# Patient Record
Sex: Female | Born: 1990 | Race: Black or African American | Hispanic: No | Marital: Single | Smoking: Former smoker
Health system: Southern US, Community
[De-identification: ages and names within clinical notes are randomized; demographics above are authoritative.]

## PROBLEM LIST (undated history)

## (undated) ENCOUNTER — Inpatient Hospital Stay (HOSPITAL_COMMUNITY): Payer: Self-pay

## (undated) DIAGNOSIS — A749 Chlamydial infection, unspecified: Secondary | ICD-10-CM

## (undated) DIAGNOSIS — D649 Anemia, unspecified: Secondary | ICD-10-CM

## (undated) HISTORY — PX: APPENDECTOMY: SHX54

---
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
## 2009-01-14 ENCOUNTER — Inpatient Hospital Stay (HOSPITAL_COMMUNITY): Admission: AD | Admit: 2009-01-14 | Discharge: 2009-01-15 | Payer: Self-pay | Admitting: Obstetrics & Gynecology

## 2009-02-16 ENCOUNTER — Emergency Department (HOSPITAL_COMMUNITY): Admission: EM | Admit: 2009-02-16 | Discharge: 2009-02-16 | Payer: Self-pay | Admitting: Family Medicine

## 2009-05-05 ENCOUNTER — Emergency Department (HOSPITAL_COMMUNITY): Admission: EM | Admit: 2009-05-05 | Discharge: 2009-05-05 | Payer: Self-pay | Admitting: Family Medicine

## 2009-06-03 ENCOUNTER — Emergency Department (HOSPITAL_COMMUNITY): Admission: EM | Admit: 2009-06-03 | Discharge: 2009-06-03 | Payer: Self-pay | Admitting: Emergency Medicine

## 2009-06-19 ENCOUNTER — Inpatient Hospital Stay (HOSPITAL_COMMUNITY): Admission: AD | Admit: 2009-06-19 | Discharge: 2009-06-19 | Payer: Self-pay | Admitting: Obstetrics and Gynecology

## 2009-06-19 ENCOUNTER — Ambulatory Visit: Payer: Self-pay | Admitting: Advanced Practice Midwife

## 2009-12-12 ENCOUNTER — Inpatient Hospital Stay (HOSPITAL_COMMUNITY): Admission: AD | Admit: 2009-12-12 | Discharge: 2009-12-12 | Payer: Self-pay | Admitting: Obstetrics & Gynecology

## 2009-12-18 ENCOUNTER — Emergency Department (HOSPITAL_COMMUNITY): Admission: EM | Admit: 2009-12-18 | Discharge: 2009-12-18 | Payer: Self-pay | Admitting: Emergency Medicine

## 2010-01-27 IMAGING — US US OB TRANSVAGINAL MODIFY
1 series · 14 of 28 positions shown · non-contrast
Comparison: none

CLINICAL DATA: 6 weeks 2 days pregnant.  The lower abdominal pain.

OBSTETRIC <14 WK US AND TRANSVAGINAL OB US
TECHNIQUE: Both transabdominal and transvaginal ultrasound
examinations were performed for complete evaluation of the
gestation as well as the maternal uterus, adnexal regions, and
pelvic cul-de-sac.
No intrauterine pregnancy is identified.  There is a hyperechoic
material within the endometrial canal measuring at least 17 mm. The
uterus is otherwise unremarkable.  The ovaries are within normal
limits bilaterally.  A small amount of free fluid is evident.

[Series 1: us ob transvaginal modify · 0.26mm/px · 14 of 45 slices shown]
[im 2/45]
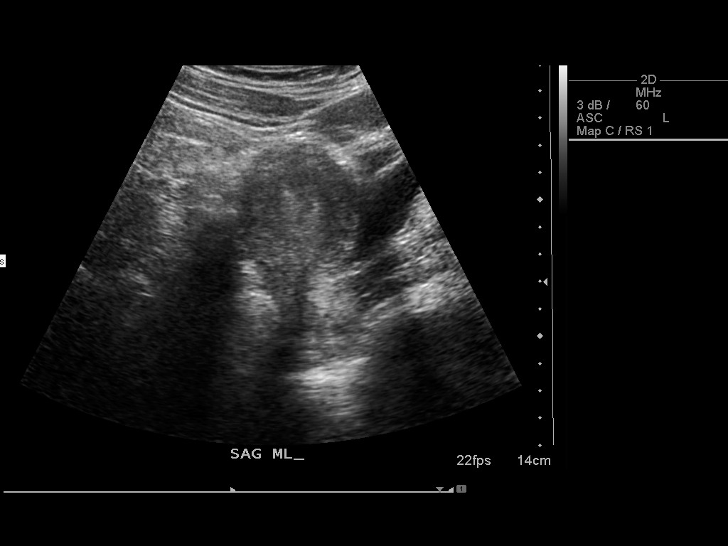
[im 5/45]
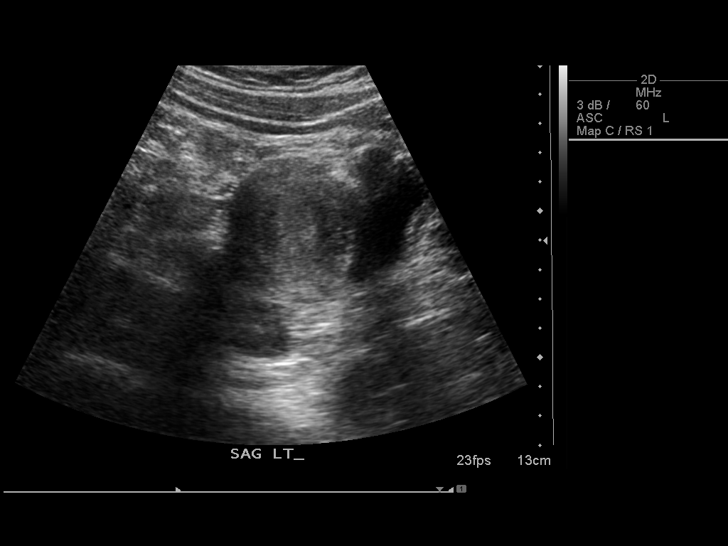
[im 9/45]
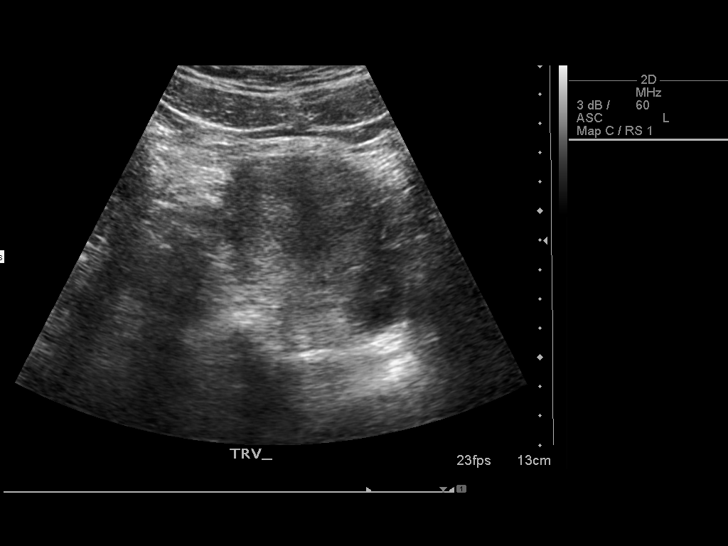
[im 12/45]
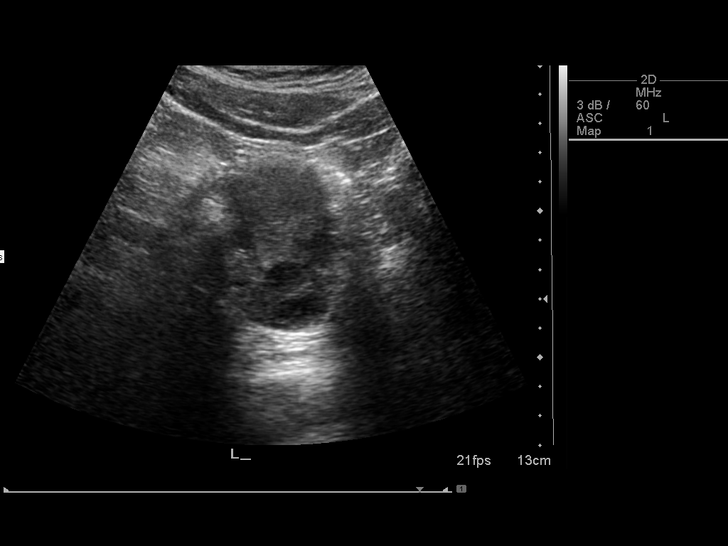
[im 15/45]
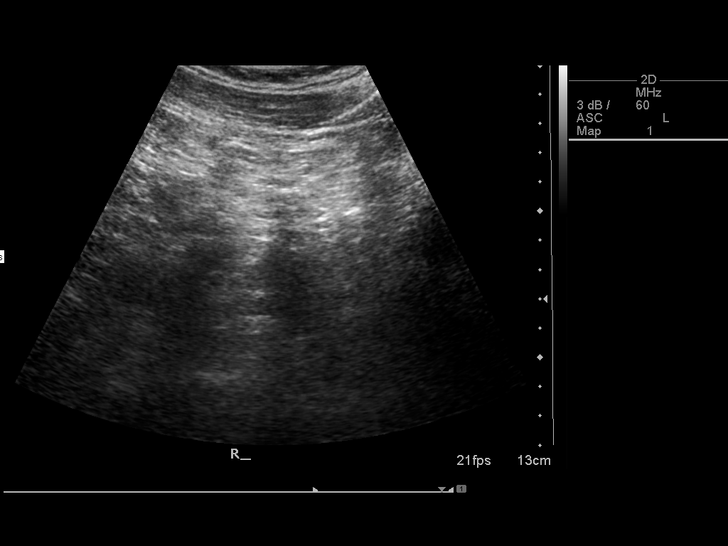
[im 18/45]
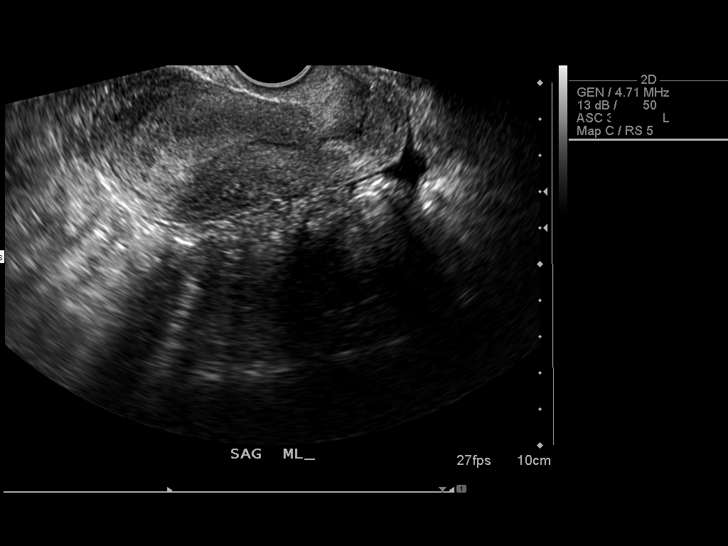
[im 22/45]
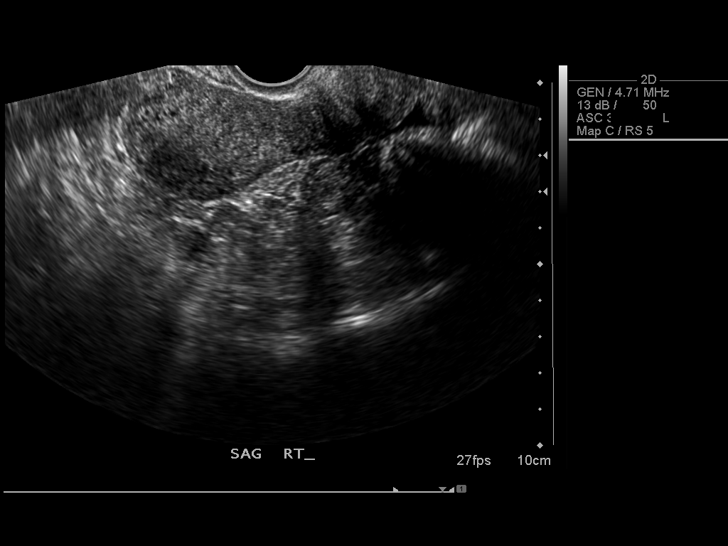
[im 25/45]
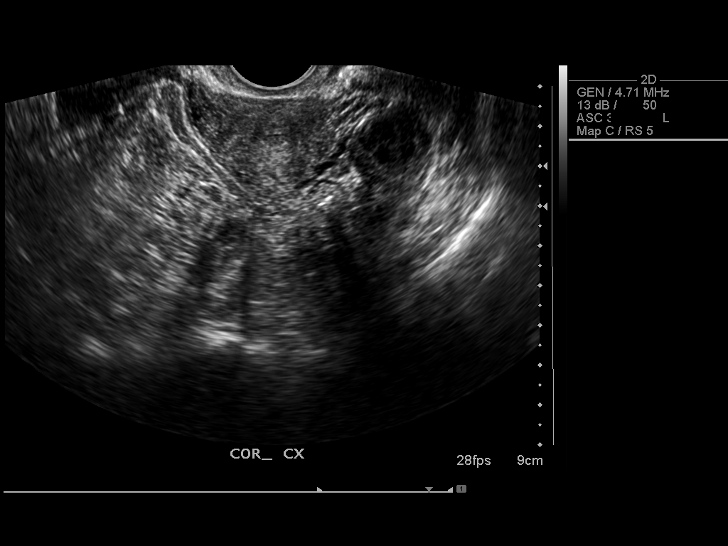
[im 28/45]
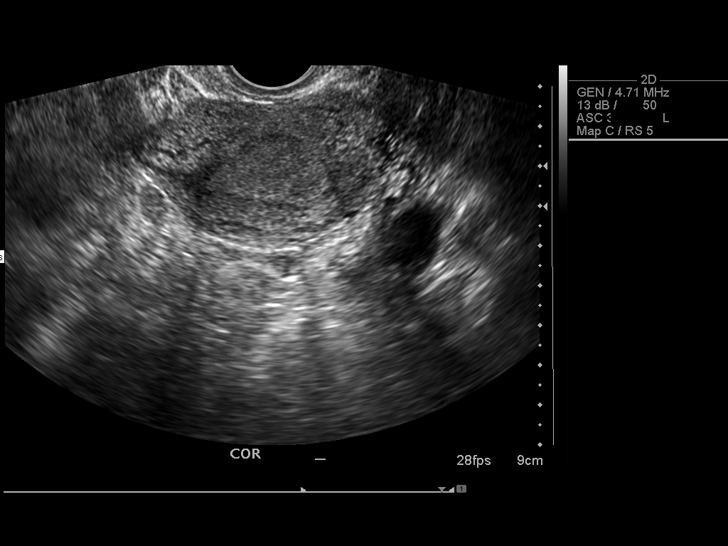
[im 31/45]
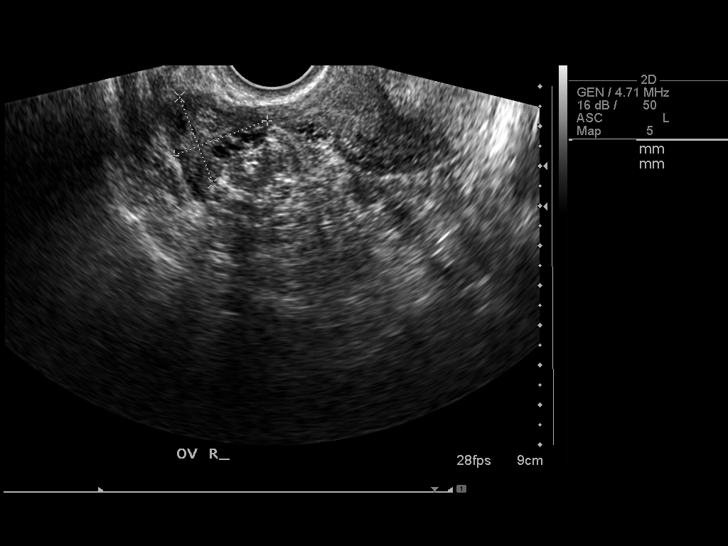
[im 35/45]
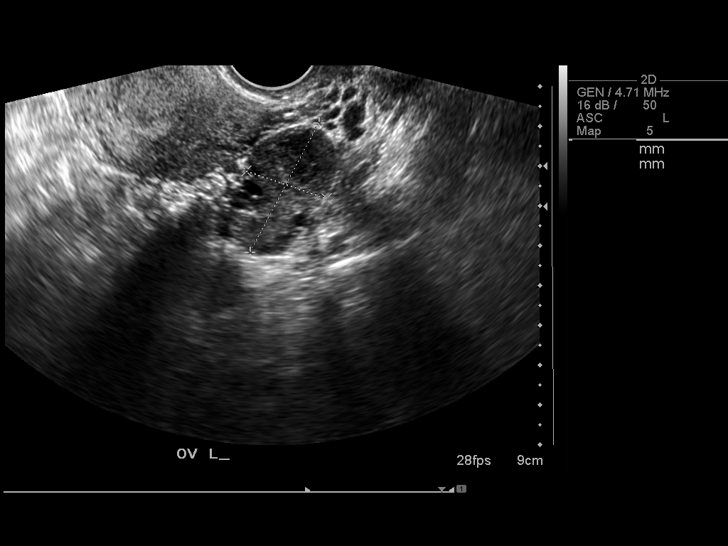
[im 38/45]
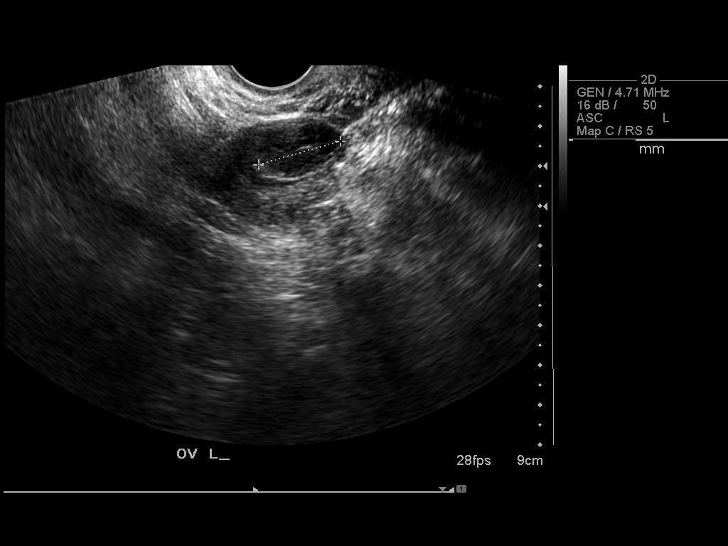
[im 41/45]
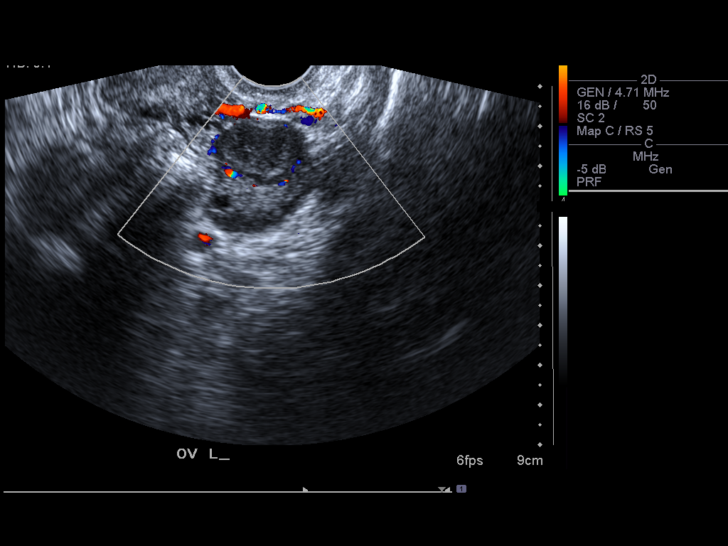
[im 45/45]
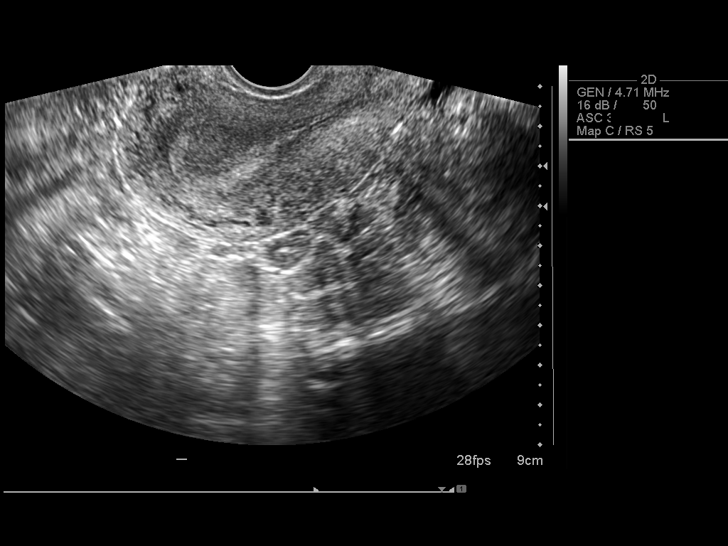

[14 of 28 positions shown; findings below may reference images not displayed]

IMPRESSION: 1.  No evidence for intrauterine pregnancy.
2.  Hyperechoic material within the endometrial cavity, concerning
for blood.  This may represent a missed spontaneous abortion.
Correlation with beta HCG is recommended.  If the quantitative beta
HCG is greater than 2888, the findings are concerning for an occult
ectopic pregnancy and follow-up exam is recommended.  If the
quantitative beta HCG is less than 2888, this may represent a
spontaneous abortion or early IUP and follow-up may can be useful.
3.  Minimal free fluid.

## 2010-02-19 ENCOUNTER — Inpatient Hospital Stay (HOSPITAL_COMMUNITY): Admission: AD | Admit: 2010-02-19 | Discharge: 2010-02-20 | Payer: Self-pay | Admitting: Obstetrics and Gynecology

## 2010-03-15 ENCOUNTER — Ambulatory Visit (HOSPITAL_COMMUNITY): Admission: RE | Admit: 2010-03-15 | Discharge: 2010-03-15 | Payer: Self-pay | Admitting: Family Medicine

## 2010-03-15 ENCOUNTER — Encounter: Payer: Self-pay | Admitting: Family Medicine

## 2010-04-05 ENCOUNTER — Ambulatory Visit (HOSPITAL_COMMUNITY): Admission: RE | Admit: 2010-04-05 | Discharge: 2010-04-05 | Payer: Self-pay | Admitting: Family Medicine

## 2010-04-27 ENCOUNTER — Ambulatory Visit (HOSPITAL_COMMUNITY): Admission: RE | Admit: 2010-04-27 | Discharge: 2010-04-27 | Payer: Self-pay | Admitting: Obstetrics & Gynecology

## 2010-05-31 ENCOUNTER — Inpatient Hospital Stay (HOSPITAL_COMMUNITY): Admission: AD | Admit: 2010-05-31 | Discharge: 2010-06-01 | Payer: Self-pay | Admitting: Obstetrics & Gynecology

## 2010-05-31 ENCOUNTER — Ambulatory Visit: Payer: Self-pay | Admitting: Gynecology

## 2010-06-01 ENCOUNTER — Encounter: Payer: Self-pay | Admitting: Obstetrics & Gynecology

## 2010-07-28 ENCOUNTER — Ambulatory Visit: Payer: Self-pay | Admitting: Nurse Practitioner

## 2010-07-28 ENCOUNTER — Inpatient Hospital Stay (HOSPITAL_COMMUNITY): Admission: AD | Admit: 2010-07-28 | Discharge: 2010-07-29 | Payer: Self-pay | Admitting: Obstetrics & Gynecology

## 2010-09-08 ENCOUNTER — Ambulatory Visit (HOSPITAL_COMMUNITY)
Admission: RE | Admit: 2010-09-08 | Discharge: 2010-09-08 | Payer: Self-pay | Source: Home / Self Care | Admitting: Obstetrics & Gynecology

## 2010-09-10 ENCOUNTER — Inpatient Hospital Stay (HOSPITAL_COMMUNITY)
Admission: AD | Admit: 2010-09-10 | Discharge: 2010-09-15 | Payer: Self-pay | Source: Home / Self Care | Attending: Obstetrics & Gynecology | Admitting: Obstetrics & Gynecology

## 2010-09-25 ENCOUNTER — Emergency Department (HOSPITAL_COMMUNITY)
Admission: EM | Admit: 2010-09-25 | Discharge: 2010-09-25 | Payer: Self-pay | Source: Home / Self Care | Admitting: Emergency Medicine

## 2010-10-29 ENCOUNTER — Encounter: Payer: Self-pay | Admitting: Obstetrics & Gynecology

## 2010-10-29 ENCOUNTER — Encounter: Payer: Self-pay | Admitting: Family Medicine

## 2010-12-19 LAB — URINE MICROSCOPIC-ADD ON

## 2010-12-19 LAB — URINALYSIS, ROUTINE W REFLEX MICROSCOPIC
Bilirubin Urine: NEGATIVE
Glucose, UA: NEGATIVE mg/dL
Nitrite: NEGATIVE
Specific Gravity, Urine: >1.03 — ABNORMAL HIGH (ref 1.005–1.030)
pH: 6 (ref 5.0–8.0)

## 2010-12-19 LAB — CROSSMATCH
ABO/RH(D): O POS
Antibody Screen: NEGATIVE
Unit division: 0

## 2010-12-19 LAB — CBC
HCT: 21.1 % — ABNORMAL LOW (ref 36.0–46.0)
HCT: 22.5 % — ABNORMAL LOW (ref 36.0–46.0)
Hemoglobin: 7 g/dL — ABNORMAL LOW (ref 12.0–15.0)
MCH: 20 pg — ABNORMAL LOW (ref 26.0–34.0)
MCH: 20.1 pg — ABNORMAL LOW (ref 26.0–34.0)
MCHC: 30.9 g/dL (ref 30.0–36.0)
MCV: 65.1 fL — ABNORMAL LOW (ref 78.0–100.0)
MCV: 65.8 fL — ABNORMAL LOW (ref 78.0–100.0)
Platelets: 126 10*3/uL — ABNORMAL LOW (ref 150–400)
Platelets: 164 10*3/uL (ref 150–400)
Platelets: 188 10*3/uL (ref 150–400)
RBC: 2.49 MIL/uL — ABNORMAL LOW (ref 3.87–5.11)
RBC: 3.34 MIL/uL — ABNORMAL LOW (ref 3.87–5.11)
RBC: 3.46 MIL/uL — ABNORMAL LOW (ref 3.87–5.11)
RDW: 23.1 % — ABNORMAL HIGH (ref 11.5–15.5)
RDW: 23.5 % — ABNORMAL HIGH (ref 11.5–15.5)
WBC: 12 10*3/uL — ABNORMAL HIGH (ref 4.0–10.5)
WBC: 15.6 10*3/uL — ABNORMAL HIGH (ref 4.0–10.5)
WBC: 16 10*3/uL — ABNORMAL HIGH (ref 4.0–10.5)
WBC: 9.1 10*3/uL (ref 4.0–10.5)

## 2010-12-19 LAB — TYPE AND SCREEN
ABO/RH(D): O POS
Antibody Screen: NEGATIVE

## 2010-12-19 LAB — COMPREHENSIVE METABOLIC PANEL
ALT: 8 U/L (ref 0–35)
AST: 18 U/L (ref 0–37)
Albumin: 2.8 g/dL — ABNORMAL LOW (ref 3.5–5.2)
Alkaline Phosphatase: 88 U/L (ref 39–117)
Potassium: 4 mEq/L (ref 3.5–5.1)
Sodium: 137 mEq/L (ref 135–145)
Total Protein: 5.9 g/dL — ABNORMAL LOW (ref 6.0–8.3)

## 2010-12-19 LAB — HEMOGLOBIN AND HEMATOCRIT, BLOOD: Hemoglobin: 4.7 g/dL — CL (ref 12.0–15.0)

## 2010-12-19 LAB — RPR: RPR Ser Ql: NONREACTIVE

## 2010-12-20 LAB — WET PREP, GENITAL
Trich, Wet Prep: NONE SEEN
Yeast Wet Prep HPF POC: NONE SEEN

## 2010-12-20 LAB — URINALYSIS, ROUTINE W REFLEX MICROSCOPIC
Ketones, ur: NEGATIVE mg/dL
Nitrite: NEGATIVE
Protein, ur: NEGATIVE mg/dL

## 2010-12-22 LAB — COMPREHENSIVE METABOLIC PANEL
ALT: 15 U/L (ref 0–35)
AST: 24 U/L (ref 0–37)
Alkaline Phosphatase: 41 U/L (ref 39–117)
CO2: 20 mEq/L (ref 19–32)
Calcium: 8.8 mg/dL (ref 8.4–10.5)
Chloride: 105 mEq/L (ref 96–112)
GFR calc non Af Amer: >60 mL/min (ref >60)
Glucose, Bld: 89 mg/dL (ref 70–99)
Sodium: 133 mEq/L — ABNORMAL LOW (ref 135–145)
Total Bilirubin: 0.4 mg/dL (ref 0.3–1.2)

## 2010-12-22 LAB — DIFFERENTIAL
Basophils Absolute: 0 10*3/uL (ref 0.0–0.1)
Eosinophils Absolute: 0.1 10*3/uL (ref 0.0–0.7)
Lymphs Abs: 0.9 10*3/uL (ref 0.7–4.0)
Monocytes Absolute: 0.6 10*3/uL (ref 0.1–1.0)
Monocytes Relative: 6 % (ref 3–12)
Neutro Abs: 9.2 10*3/uL — ABNORMAL HIGH (ref 1.7–7.7)

## 2010-12-22 LAB — CBC
HCT: 23.8 % — ABNORMAL LOW (ref 36.0–46.0)
Hemoglobin: 7.5 g/dL — ABNORMAL LOW (ref 12.0–15.0)
MCHC: 31.5 g/dL (ref 30.0–36.0)
RBC: 3.34 MIL/uL — ABNORMAL LOW (ref 3.87–5.11)

## 2010-12-22 LAB — URINALYSIS, ROUTINE W REFLEX MICROSCOPIC
Bilirubin Urine: NEGATIVE
Hgb urine dipstick: NEGATIVE
Ketones, ur: NEGATIVE mg/dL
Nitrite: NEGATIVE
Urobilinogen, UA: 0.2 mg/dL (ref 0.0–1.0)
pH: 7 (ref 5.0–8.0)

## 2010-12-22 LAB — URINE MICROSCOPIC-ADD ON

## 2010-12-25 LAB — URINALYSIS, ROUTINE W REFLEX MICROSCOPIC
Bilirubin Urine: NEGATIVE
Glucose, UA: NEGATIVE mg/dL
Ketones, ur: NEGATIVE mg/dL
Nitrite: NEGATIVE
Specific Gravity, Urine: >1.03 — ABNORMAL HIGH (ref 1.005–1.030)
pH: 5.5 (ref 5.0–8.0)

## 2010-12-25 LAB — WET PREP, GENITAL: Trich, Wet Prep: NONE SEEN

## 2010-12-25 LAB — GC/CHLAMYDIA PROBE AMP, GENITAL: Chlamydia, DNA Probe: NEGATIVE

## 2010-12-25 LAB — POCT PREGNANCY, URINE: Preg Test, Ur: POSITIVE

## 2010-12-31 LAB — GC/CHLAMYDIA PROBE AMP, GENITAL: GC Probe Amp, Genital: NEGATIVE

## 2010-12-31 LAB — WET PREP, GENITAL
Trich, Wet Prep: NONE SEEN
Yeast Wet Prep HPF POC: NONE SEEN

## 2010-12-31 LAB — URINALYSIS, ROUTINE W REFLEX MICROSCOPIC
Bilirubin Urine: NEGATIVE
Hgb urine dipstick: NEGATIVE
Ketones, ur: NEGATIVE mg/dL
Nitrite: NEGATIVE
Specific Gravity, Urine: >1.03 — ABNORMAL HIGH (ref 1.005–1.030)
Urobilinogen, UA: 0.2 mg/dL (ref 0.0–1.0)
pH: 5.5 (ref 5.0–8.0)

## 2011-01-14 LAB — WET PREP, GENITAL

## 2011-01-14 LAB — GC/CHLAMYDIA PROBE AMP, GENITAL: GC Probe Amp, Genital: NEGATIVE

## 2011-01-14 LAB — POCT PREGNANCY, URINE: Preg Test, Ur: NEGATIVE

## 2011-01-16 LAB — POCT URINALYSIS DIP (DEVICE)
Glucose, UA: NEGATIVE mg/dL
Hgb urine dipstick: NEGATIVE
Ketones, ur: NEGATIVE mg/dL
Nitrite: NEGATIVE
Protein, ur: 30 mg/dL — AB
Specific Gravity, Urine: 1.025 (ref 1.005–1.030)
Urobilinogen, UA: 1 mg/dL (ref 0.0–1.0)
pH: 6.5 (ref 5.0–8.0)

## 2011-01-16 LAB — GC/CHLAMYDIA PROBE AMP, GENITAL
Chlamydia, DNA Probe: NEGATIVE
GC Probe Amp, Genital: NEGATIVE

## 2011-01-16 LAB — WET PREP, GENITAL
Trich, Wet Prep: NONE SEEN
Yeast Wet Prep HPF POC: NONE SEEN

## 2011-01-16 LAB — RPR: RPR Ser Ql: NONREACTIVE

## 2011-01-16 LAB — POCT PREGNANCY, URINE: Preg Test, Ur: NEGATIVE

## 2011-01-17 LAB — GC/CHLAMYDIA PROBE AMP, GENITAL
Chlamydia, DNA Probe: NEGATIVE
GC Probe Amp, Genital: NEGATIVE

## 2011-01-17 LAB — CBC
Platelets: 203 10*3/uL (ref 150–400)
RBC: 3.38 MIL/uL — ABNORMAL LOW (ref 3.87–5.11)
WBC: 8.1 10*3/uL (ref 4.0–10.5)

## 2011-01-17 LAB — DIFFERENTIAL
Lymphocytes Relative: 7 % — ABNORMAL LOW (ref 12–46)
Lymphs Abs: 0.5 10*3/uL — ABNORMAL LOW (ref 0.7–4.0)
Neutro Abs: 6.9 10*3/uL (ref 1.7–7.7)
Neutrophils Relative %: 85 % — ABNORMAL HIGH (ref 43–77)

## 2011-01-18 LAB — CBC
HCT: 27 % — ABNORMAL LOW (ref 36.0–46.0)
Platelets: 234 10*3/uL (ref 150–400)
WBC: 10 10*3/uL (ref 4.0–10.5)

## 2011-01-18 LAB — GC/CHLAMYDIA PROBE AMP, GENITAL
Chlamydia, DNA Probe: NEGATIVE
Chlamydia, DNA Probe: NEGATIVE
GC Probe Amp, Genital: NEGATIVE

## 2011-01-18 LAB — URINALYSIS, ROUTINE W REFLEX MICROSCOPIC
Bilirubin Urine: NEGATIVE
Nitrite: NEGATIVE
Specific Gravity, Urine: >1.03 — ABNORMAL HIGH (ref 1.005–1.030)
Urobilinogen, UA: 0.2 mg/dL (ref 0.0–1.0)

## 2011-01-18 LAB — WET PREP, GENITAL: Trich, Wet Prep: NONE SEEN

## 2011-06-19 ENCOUNTER — Emergency Department: Payer: Self-pay | Admitting: Emergency Medicine

## 2011-06-29 LAB — URINE MICROSCOPIC-ADD ON

## 2011-06-29 LAB — URINALYSIS, ROUTINE W REFLEX MICROSCOPIC
Bilirubin Urine: NEGATIVE
Glucose, UA: NEGATIVE
Hgb urine dipstick: NEGATIVE
Ketones, ur: NEGATIVE
Nitrite: NEGATIVE
Protein, ur: NEGATIVE
Specific Gravity, Urine: 1.025
Urobilinogen, UA: 0.2
pH: 6

## 2011-06-29 LAB — POCT PREGNANCY, URINE
Operator id: 280921
Preg Test, Ur: NEGATIVE

## 2011-07-09 LAB — POCT PREGNANCY, URINE: Preg Test, Ur: NEGATIVE

## 2011-07-10 LAB — WET PREP, GENITAL
Clue Cells Wet Prep HPF POC: NONE SEEN
Trich, Wet Prep: NONE SEEN

## 2011-07-10 LAB — GC/CHLAMYDIA PROBE AMP, GENITAL
Chlamydia, DNA Probe: NEGATIVE
GC Probe Amp, Genital: NEGATIVE

## 2011-07-10 LAB — POCT PREGNANCY, URINE: Preg Test, Ur: POSITIVE

## 2011-07-10 LAB — CBC
MCHC: 32.2
RBC: 3.56 — ABNORMAL LOW
RDW: 20.9 — ABNORMAL HIGH

## 2011-07-10 LAB — URINALYSIS, ROUTINE W REFLEX MICROSCOPIC
Bilirubin Urine: NEGATIVE
Nitrite: NEGATIVE
Specific Gravity, Urine: >1.03 — ABNORMAL HIGH
Urobilinogen, UA: 0.2

## 2011-07-13 LAB — HCG, QUANTITATIVE, PREGNANCY
hCG, Beta Chain, Quant, S: 4055 m[IU]/mL — ABNORMAL HIGH (ref <5)
hCG, Beta Chain, Quant, S: 78 m[IU]/mL — ABNORMAL HIGH (ref <5)

## 2011-07-17 LAB — URINE MICROSCOPIC-ADD ON

## 2011-07-17 LAB — URINALYSIS, ROUTINE W REFLEX MICROSCOPIC
Glucose, UA: NEGATIVE
Leukocytes, UA: NEGATIVE
Nitrite: NEGATIVE
Specific Gravity, Urine: >1.03 — ABNORMAL HIGH
pH: 5.5

## 2011-07-17 LAB — WET PREP, GENITAL
Clue Cells Wet Prep HPF POC: NONE SEEN
Trich, Wet Prep: NONE SEEN
Yeast Wet Prep HPF POC: NONE SEEN

## 2011-07-17 LAB — POCT PREGNANCY, URINE: Operator id: 114931

## 2011-10-09 DIAGNOSIS — A749 Chlamydial infection, unspecified: Secondary | ICD-10-CM

## 2011-10-09 HISTORY — DX: Chlamydial infection, unspecified: A74.9

## 2012-01-25 ENCOUNTER — Inpatient Hospital Stay (HOSPITAL_COMMUNITY)
Admission: AD | Admit: 2012-01-25 | Discharge: 2012-01-25 | Discharge status: Against Medical Advice | Payer: Self-pay | Source: Ambulatory Visit | Attending: Obstetrics & Gynecology | Admitting: Obstetrics & Gynecology

## 2012-01-25 NOTE — MAU Note (Signed)
Patient is not in the lobby when called to triage.  

## 2012-01-25 NOTE — MAU Note (Signed)
Patient not in the lobby when called to triage.  

## 2012-02-02 ENCOUNTER — Inpatient Hospital Stay (HOSPITAL_COMMUNITY)
Admission: AD | Admit: 2012-02-02 | Discharge: 2012-02-02 | Discharge status: Against Medical Advice | Payer: Self-pay | Source: Ambulatory Visit | Attending: Obstetrics | Admitting: Obstetrics

## 2012-02-02 ENCOUNTER — Encounter (HOSPITAL_COMMUNITY): Payer: Self-pay | Admitting: Obstetrics and Gynecology

## 2012-02-02 DIAGNOSIS — Z3202 Encounter for pregnancy test, result negative: Secondary | ICD-10-CM

## 2012-02-02 DIAGNOSIS — N912 Amenorrhea, unspecified: Secondary | ICD-10-CM | POA: Insufficient documentation

## 2012-02-02 DIAGNOSIS — N949 Unspecified condition associated with female genital organs and menstrual cycle: Secondary | ICD-10-CM | POA: Insufficient documentation

## 2012-02-02 DIAGNOSIS — N926 Irregular menstruation, unspecified: Secondary | ICD-10-CM

## 2012-02-02 HISTORY — DX: Chlamydial infection, unspecified: A74.9

## 2012-02-02 HISTORY — DX: Anemia, unspecified: D64.9

## 2012-02-02 LAB — URINALYSIS, ROUTINE W REFLEX MICROSCOPIC
Glucose, UA: NEGATIVE mg/dL
Ketones, ur: NEGATIVE mg/dL
Leukocytes, UA: NEGATIVE
Protein, ur: NEGATIVE mg/dL
Urobilinogen, UA: 0.2 mg/dL (ref 0.0–1.0)

## 2012-02-02 NOTE — MAU Note (Signed)
Pt presents to MAU with chief complaint of "missed period times 2 months". Pt says she has never had irregular cycles; but in the last 2 months she has not had a period. Pt has a new sexual partner and recently noticed a discharge; thick, white with foul odor. Pt is a G5P1

## 2012-02-02 NOTE — Progress Notes (Signed)
Pt had to leave AMA due to chid care issues.

## 2012-02-02 NOTE — Discharge Instructions (Signed)
Secondary Amenorrhea  Secondary amenorrhea is the stopping of menstrual flow for 3 to 6 months in a female who has previously had periods. There are many possible causes. Most of these causes are not serious. Usually treating the underlying problem causing the loss of menses will return your periods to normal. CAUSES  Some common and uncommon causes of not menstruating include:  Malnutrition.   Low blood sugar (hypoglycemia).   Polycystic ovarian disease.   Stress or fear.   Breastfeeding.   Hormone imbalance.   Ovarian failure.   Medications.   Extreme obesity.   Cystic fibrosis.   Low body weight or drastic weight reduction from any cause.   Early menopause.   Removal of ovaries or uterus.   Contraceptives.   Illness.   Long term (chronic) illnesses.   Cushing's syndrome.   Thyroid problems.   Birth control pills, patches, or vaginal rings for birth control.  DIAGNOSIS  This diagnosis is made by your caregiver taking a medical history and doing a physical exam. Pregnancy must be ruled out. Often times, numerous blood tests of different hormones in the body may be measured. Urine testing may be done. Specialized x-rays may have to be done as well as measuring the body mass index (BMI). TREATMENT  Treatment depends on the cause of the amenorrhea. If an eating disorder is present, this can be treated with an adequate diet and therapy. Chronic illnesses may improve with treatment of the illness. Overall, the outlook is good. The amenorrhea may be corrected with medications, lifestyle changes, or surgery. If the amenorrhea cannot be corrected, it is sometimes possible to create a false menstruation with medications. Document Released: 11/05/2006 Document Revised: 09/13/2011 Document Reviewed: 09/12/2007 Exit[employer] Patient Information 2012 [employer]. 

## 2012-02-02 NOTE — MAU Provider Note (Signed)
History     CSN: [id number]  Arrival date and time: 02/02/12 1640   First Provider Initiated Contact with Patient 02/02/12 1755      Chief Complaint  Patient presents with  . Vaginal Discharge  . Amenorrhea   HPI Sharon Conner is 21 y.o. [phone] Unknown weeks presenting missed period for 2 months.  When I came in the room patient states she has to go home to take care of her daughter- who had accident in her diaper and there are no clothes for her.  She denies any urgent need for care.  Denies pain.  She has to go home to take care of her daughter.  Plans to go to health department for birth control that she needs now.      Past Medical History  Diagnosis Date  . Anemia   . Chlamydia infection     Past Surgical History  Procedure Date  . Appendectomy     History reviewed. No pertinent family history.  History  Substance Use Topics  . Smoking status: Current Some Day Smoker  . Smokeless tobacco: Not on file  . Alcohol Use: Yes    Allergies: No Known Allergies  Prescriptions prior to admission  Medication Sig Dispense Refill  . acetaminophen (TYLENOL) 500 MG tablet Take 1,000 mg by mouth every 6 (six) hours as needed. For pain        Review of Systems  Constitutional: Negative.   Gastrointestinal: Negative for nausea, vomiting and abdominal pain.  Genitourinary:       Missed periods.  Neg for vaginal bleeding  + for vaginal discharge   Physical Exam   Blood pressure 135/80, pulse 68, temperature 98.2 F (36.8 C), temperature source Oral, resp. rate 18.  Physical Exam  Constitutional: She is oriented to person, place, and time. She appears well-developed and well-nourished. No distress.  HENT:  Head: Normocephalic.  Genitourinary:       See initial note, unable to stay for exam  Neurological: She is alert and oriented to person, place, and time.  Psychiatric: She has a normal mood and affect. Her behavior is normal.     Results for orders placed  during the hospital encounter of 02/02/12 (from the past 24 hour(s))  URINALYSIS, ROUTINE W REFLEX MICROSCOPIC     Status: Abnormal   Collection Time   02/02/12  4:45 PM      Component Value Range   Color, Urine YELLOW  YELLOW    APPearance CLEAR  CLEAR    Specific Gravity, Urine >1.030 (*) 1.005 - 1.030    pH 5.5  5.0 - 8.0    Glucose, UA NEGATIVE  NEGATIVE (mg/dL)   Hgb urine dipstick NEGATIVE  NEGATIVE    Bilirubin Urine NEGATIVE  NEGATIVE    Ketones, ur NEGATIVE  NEGATIVE (mg/dL)   Protein, ur NEGATIVE  NEGATIVE (mg/dL)   Urobilinogen, UA 0.2  0.0 - 1.0 (mg/dL)   Nitrite NEGATIVE  NEGATIVE    Leukocytes, UA NEGATIVE  NEGATIVE   POCT PREGNANCY, URINE     Status: Normal   Collection Time   02/02/12  4:50 PM      Component Value Range   Preg Test, Ur NEGATIVE  NEGATIVE     MAU Course  Procedures  MDM--discussed with patient that I will be glad to examine her.  She decline stating she had to go take clean clothes to her baby.  She states she will go to [facility] for STD check and  birth control.    Assessment and Plan  A:  Amenorrhea       Vaginal discharge  P:  Patient will call health department for appt to discuss birth control and have STI screening.    Patient checked out AMA  Deisha Stull,EVE M 02/02/2012, 5:57 PM

## 2012-02-03 NOTE — MAU Provider Note (Signed)
Chart reviewed and agree with management and plan.  

## 2012-02-12 ENCOUNTER — Inpatient Hospital Stay (HOSPITAL_COMMUNITY)
Admission: AD | Admit: 2012-02-12 | Discharge: 2012-02-12 | Discharge status: Against Medical Advice | Payer: Self-pay | Source: Ambulatory Visit | Attending: Family Medicine | Admitting: Family Medicine

## 2012-02-12 DIAGNOSIS — N949 Unspecified condition associated with female genital organs and menstrual cycle: Secondary | ICD-10-CM | POA: Insufficient documentation

## 2012-02-12 DIAGNOSIS — R109 Unspecified abdominal pain: Secondary | ICD-10-CM | POA: Insufficient documentation

## 2012-02-12 DIAGNOSIS — N912 Amenorrhea, unspecified: Secondary | ICD-10-CM | POA: Insufficient documentation

## 2012-02-12 LAB — URINALYSIS, ROUTINE W REFLEX MICROSCOPIC
Bilirubin Urine: NEGATIVE
Ketones, ur: NEGATIVE mg/dL
Nitrite: NEGATIVE
Specific Gravity, Urine: >1.03 — ABNORMAL HIGH (ref 1.005–1.030)
Urobilinogen, UA: 0.2 mg/dL (ref 0.0–1.0)

## 2012-02-12 LAB — URINE MICROSCOPIC-ADD ON

## 2012-02-12 NOTE — MAU Note (Signed)
Not in lobby

## 2012-02-12 NOTE — MAU Note (Signed)
Pt not in lobby.  

## 2012-02-12 NOTE — MAU Note (Signed)
Been having pain in lower abd- shooting into vagina- started after was last here. Hasn't had a period in 2+ months.  Has a d/c with an odor.

## 2012-05-07 ENCOUNTER — Emergency Department (HOSPITAL_COMMUNITY)
Admission: EM | Admit: 2012-05-07 | Discharge: 2012-05-07 | Disposition: A | Discharge status: Home / Self Care | Payer: Self-pay | Attending: Emergency Medicine | Admitting: Emergency Medicine

## 2012-05-07 ENCOUNTER — Encounter (HOSPITAL_COMMUNITY): Payer: Self-pay | Admitting: *Deleted

## 2012-05-07 DIAGNOSIS — D649 Anemia, unspecified: Secondary | ICD-10-CM | POA: Insufficient documentation

## 2012-05-07 DIAGNOSIS — K649 Unspecified hemorrhoids: Secondary | ICD-10-CM

## 2012-05-07 DIAGNOSIS — K644 Residual hemorrhoidal skin tags: Secondary | ICD-10-CM | POA: Insufficient documentation

## 2012-05-07 DIAGNOSIS — F17200 Nicotine dependence, unspecified, uncomplicated: Secondary | ICD-10-CM | POA: Insufficient documentation

## 2012-05-07 MED ORDER — DOCUSATE SODIUM 100 MG PO CAPS: 100.0000 mg | ORAL_CAPSULE | Freq: Two times a day (BID) | ORAL | Status: AC

## 2012-05-07 MED ORDER — HYDROCORTISONE ACETATE 25 MG RE SUPP: 25.0000 mg | Freq: Two times a day (BID) | RECTAL | Status: AC

## 2012-05-07 NOTE — ED Notes (Signed)
Reports no history of hemorrhoids, stated went on internet & looked it up. Denies bleeding. Reports stools are usually hard & has to strain a lot with BM's. C/o painful walking

## 2012-05-07 NOTE — ED Provider Notes (Signed)
Medical screening examination/treatment/procedure(s) were performed by non-physician practitioner and as supervising physician I was immediately available for consultation/collaboration.   Carleene Cooper III, MD 05/07/12 2021

## 2012-05-07 NOTE — ED Notes (Signed)
C/o rectal pain due to hemorrhoids since yesterday

## 2012-05-07 NOTE — ED Provider Notes (Signed)
History     CSN: [id number]  Arrival date & time 05/07/12  4098   First MD Initiated Contact with Patient 05/07/12 [phone]      Chief Complaint  Patient presents with  . Hemorrhoids    (Consider location/radiation/quality/duration/timing/severity/associated sxs/prior treatment) HPI Comments: Sharon Conner presents with a hemorrhoid which started being painful yesterday.  She denies rectal bleeding, but does have problems with chronic constipation.  She denies fevers or chills and has no complaint of abdominal pain,  Nausea or vomiting.  She has taken no medication or treatment for her symptoms.  The history is provided by the patient.    Past Medical History  Diagnosis Date  . Anemia   . Chlamydia infection     Past Surgical History  Procedure Date  . Appendectomy     No family history on file.  History  Substance Use Topics  . Smoking status: Current Some Day Smoker  . Smokeless tobacco: Not on file  . Alcohol Use: Yes    OB History    Grav Para Term Preterm Abortions TAB SAB Ect Mult Living   5 1 1  0 4 3 1  0 0 1      Review of Systems  Constitutional: Negative for fever.  HENT: Negative for congestion, sore throat and neck pain.   Eyes: Negative.   Respiratory: Negative for chest tightness and shortness of breath.   Cardiovascular: Negative for chest pain.  Gastrointestinal: Positive for constipation and rectal pain. Negative for nausea, vomiting, abdominal pain, diarrhea and blood in stool.  Genitourinary: Negative.   Musculoskeletal: Negative for joint swelling and arthralgias.  Skin: Negative.  Negative for rash and wound.  Neurological: Negative for dizziness, weakness, light-headedness, numbness and headaches.  Hematological: Negative.   Psychiatric/Behavioral: Negative.     Allergies  Review of patient's allergies indicates no known allergies.  Home Medications   Current Outpatient Rx  Name Route Sig Dispense Refill  . DOCUSATE SODIUM 100 MG  PO CAPS Oral Take 1 capsule (100 mg total) by mouth every 12 (twelve) hours. 30 capsule 0  . HYDROCORTISONE ACETATE 25 MG RE SUPP Rectal Place 1 suppository (25 mg total) rectally 2 (two) times daily. 12 suppository 0    BP 103/68  Pulse 67  Temp 98.4 F (36.9 C) (Oral)  Resp 20  Ht 5\' 3"  (1.6 m)  Wt 174 lb (78.926 kg)  BMI 30.82 kg/m2  SpO2 100%  LMP 04/28/2012  Physical Exam  Nursing note and vitals reviewed. Constitutional: She appears well-developed and well-nourished.  HENT:  Head: Normocephalic and atraumatic.  Neck: Neck supple.  Cardiovascular: Normal rate.   Pulmonary/Chest: Effort normal and breath sounds normal.  Abdominal: Soft. Bowel sounds are normal. There is no tenderness.  Genitourinary: Rectal exam shows external hemorrhoid and tenderness.       Hemorrhoid is not thrombosed.    Musculoskeletal: Normal range of motion.  Neurological: She is alert.  Skin: Skin is warm and dry.  Psychiatric: She has a normal mood and affect.    ED Course  Procedures (including critical care time)  Labs Reviewed - No data to display No results found.   1. Hemorrhoids     Gentle pressure applied to hemorrhoid with mild reduction appreciated.  MDM  Anusol suppositories,  Warm soaks,  Colace.  Increase fluids,  Fiber.  Recheck by pcp if not improving over the next week, or if sx worsen.  The patient appears reasonably screened and/or stabilized for discharge and I  doubt any other medical condition or other [facility] requiring further screening, evaluation, or treatment in the ED at this time prior to discharge.         Burgess Amor, [place] 05/07/12 1039

## 2012-08-24 ENCOUNTER — Inpatient Hospital Stay (HOSPITAL_COMMUNITY): Payer: Self-pay

## 2012-08-24 ENCOUNTER — Encounter (HOSPITAL_COMMUNITY): Payer: Self-pay | Admitting: Obstetrics and Gynecology

## 2012-08-24 ENCOUNTER — Inpatient Hospital Stay (HOSPITAL_COMMUNITY)
Admission: AD | Admit: 2012-08-24 | Discharge: 2012-08-24 | Disposition: A | Discharge status: Home / Self Care | Payer: Self-pay | Source: Ambulatory Visit | Attending: Obstetrics and Gynecology | Admitting: Obstetrics and Gynecology

## 2012-08-24 DIAGNOSIS — R109 Unspecified abdominal pain: Secondary | ICD-10-CM | POA: Insufficient documentation

## 2012-08-24 DIAGNOSIS — Z3490 Encounter for supervision of normal pregnancy, unspecified, unspecified trimester: Secondary | ICD-10-CM

## 2012-08-24 DIAGNOSIS — O209 Hemorrhage in early pregnancy, unspecified: Secondary | ICD-10-CM | POA: Insufficient documentation

## 2012-08-24 LAB — WET PREP, GENITAL
Trich, Wet Prep: NONE SEEN
Yeast Wet Prep HPF POC: NONE SEEN

## 2012-08-24 LAB — URINALYSIS, ROUTINE W REFLEX MICROSCOPIC
Glucose, UA: NEGATIVE mg/dL
Leukocytes, UA: NEGATIVE
pH: 6 (ref 5.0–8.0)

## 2012-08-24 NOTE — MAU Provider Note (Signed)
History     CSN: [id number]  Arrival date and time: 08/24/12 2006   None     Chief Complaint  Patient presents with  . Amenorrhea  . Dysuria   HPI Sharon Conner is a 21 y.o. female who presents to MAU for pregnancy test. She states she missed her period past 4 months. Took a pregnancy test at home about a month ago that was negative. Has had irregular periods for the past year. Last pap smear less than one year ago at [facility] and was normal. For the past week has had lower abdominal pain that feels like period cramps. She rates the pain as 7/10. The pain comes and goes. The pain is located in the mid lower abdomen.Pain feels like getting ready to start period. Current sex partner x 1 year. No birth control. Hx of Chlamydia and GC approximately 4 years ago. The history was provided by the patient.  OB History    Grav Para Term Preterm Abortions TAB SAB Ect Mult Living   7 1 1  0 4 3 1  0 0 1      Past Medical History  Diagnosis Date  . Anemia   . Chlamydia infection     Past Surgical History  Procedure Date  . Appendectomy     History reviewed. No pertinent family history.  History  Substance Use Topics  . Smoking status: Current Some Day Smoker  . Smokeless tobacco: Not on file  . Alcohol Use: Yes    Allergies: No Known Allergies  No prescriptions prior to admission    Review of Systems  Constitutional: Positive for weight loss. Negative for fever and chills.  HENT: Positive for congestion. Negative for ear pain, nosebleeds and neck pain.   Eyes: Negative for blurred vision, double vision, photophobia and pain.  Respiratory: Negative for cough, shortness of breath and wheezing.   Cardiovascular: Negative for chest pain, palpitations and leg swelling.  Gastrointestinal: Positive for abdominal pain and diarrhea. Negative for heartburn, nausea, vomiting and constipation.  Genitourinary: Positive for urgency and frequency. Negative for dysuria.    Musculoskeletal: Negative for myalgias and back pain.  Skin: Negative for itching and rash.  Neurological: Negative for dizziness, sensory change, speech change, seizures, weakness and headaches.  Endo/Heme/Allergies: Does not bruise/bleed easily.  Psychiatric/Behavioral: Negative for depression. The patient is not nervous/anxious and does not have insomnia.    Physical Exam   Blood pressure 124/64, pulse 67, temperature 98.4 F (36.9 C), temperature source Oral, resp. rate 16, height 5\' 3"  (1.6 m), weight 178 lb (80.74 kg), last menstrual period 04/26/2012, SpO2 100.00%.  Physical Exam  Nursing note and vitals reviewed. Constitutional: She is oriented to person, place, and time. She appears well-developed and well-nourished. No distress.  HENT:  Head: Normocephalic and atraumatic.  Eyes: EOM are normal.  Neck: Neck supple.  Cardiovascular: Normal rate.   Respiratory: Effort normal.  GI: Soft. There is tenderness in the suprapubic area. There is no rigidity, no rebound, no guarding and no CVA tenderness.       The pain is minimal.  Genitourinary:       External genitalia without lesions. Frothy discharge vaginal vault. Cervix long, closed. No CMT, no adnexal tenderness. Uterus approximately 8 week size.   Musculoskeletal: Normal range of motion.  Neurological: She is alert and oriented to person, place, and time.  Skin: Skin is warm and dry.  Psychiatric: She has a normal mood and affect. Her behavior is normal. Judgment and  thought content normal.   Blood type O positive  No results found for this or any previous visit (from the past 24 hour(s)). No results found for this or any previous visit (from the past 24 hour(s)). US Ob Comp Less 14 Wks  08/24/2012  *RADIOLOGY REPORT*  Clinical Data: Right lower quadrant pain  OBSTETRIC <14 WK ULTRASOUND  Technique:  Transabdominal ultrasound was performed for evaluation of the gestation as well as the maternal uterus and adnexal regions.   Comparison:  None.  Intrauterine gestational sac: Visualized/normal in shape. Yolk sac: Identified Embryo: Identified Cardiac Activity: Identified Heart Rate: 153 bpm  CRL:  12.8 mm  7 w  4 d       [place] EDC: 04/08/2013  Maternal uterus/Adnexae: Small subchorionic hemorrhage.  Normal sonographic appearance to the ovaries.  Right corpus luteal cyst noted.  No free fluid.  IMPRESSION: Single intrauterine gestation with cardiac activity documented. Estimated age of 7 weeks 4 days by crown-rump length.  There is a small subchorionic hemorrhage.  No free fluid.   Original Report Authenticated By: Jearld Lesch, M.D.    Assessment: 21 y.o. female @ 7 weeks 4 days gestation with abdominal pain   Discomforts of pregnancy   [facility]  Plan:  Start prenatal care    Tylenol for discomfort, return as needed I have reviewed this patient's vital signs, nurses notes, appropriate labs and imaging. I have discussed the results with the patient and she voices understanding.  MAU Course  Procedures  NEESE,HOPE, RN, FNP, [facility] 08/26/2012, 5:21 AM

## 2012-08-24 NOTE — MAU Note (Signed)
Last period was in July. Hasn't taken pregnancy test since then and it was negative. Pain with urination x1 week; urine has foul odor. Thick white discharge x2 weeks with vaginal itching.

## 2012-08-27 ENCOUNTER — Encounter (HOSPITAL_COMMUNITY): Payer: Self-pay | Admitting: *Deleted

## 2012-08-28 NOTE — MAU Provider Note (Signed)
Attestation of Attending Supervision of Advanced Practitioner: Evaluation and management procedures were performed by the PA/NP/CNM/OB Fellow under my supervision/collaboration. Chart reviewed and agree with management and plan.  Dayonna Selbe V 08/28/2012 5:09 AM    

## 2012-10-08 NOTE — L&D Delivery Note (Signed)
Attestation of Attending Supervision of Advanced Practitioner (CNM/NP): Evaluation and management procedures were performed by the Advanced Practitioner under my supervision and collaboration.  I have reviewed the Advanced Practitioner's note and chart, and I agree with the management and plan.  Shameka Aggarwal 03/30/2013 12:15 PM   

## 2012-10-08 NOTE — L&D Delivery Note (Signed)
Delivery Note At 7:39 AM a viable female was delivered via Vaginal, Spontaneous Delivery (Presentation: ; Occiput Posterior/compound right hand presentation).  APGAR: 9,9 ; weight: pending.   Placenta status: Intact, spontaneous.  Cord: 3 vessels with the following complications: None.  Cord pH: N/A  Anesthesia: None  Episiotomy: None Lacerations: None Est. Blood Loss (mL): 50 mL  Mom to postpartum.  Baby to nursery-stable.  Everlene Other 03/23/2013, 7:55 AM  I have seen and examined this patient and I agree with the above. Nursery to follow up with eval of right hand for swelling/movement. Cam Hai 9:17 AM 03/23/2013

## 2012-10-15 ENCOUNTER — Other Ambulatory Visit (HOSPITAL_COMMUNITY): Payer: Self-pay | Admitting: Nurse Practitioner

## 2012-10-15 DIAGNOSIS — Z3689 Encounter for other specified antenatal screening: Secondary | ICD-10-CM

## 2012-10-15 LAB — OB RESULTS CONSOLE RUBELLA ANTIBODY, IGM: Rubella: IMMUNE

## 2012-10-15 LAB — OB RESULTS CONSOLE HIV ANTIBODY (ROUTINE TESTING): HIV: NONREACTIVE

## 2012-10-15 LAB — OB RESULTS CONSOLE HEPATITIS B SURFACE ANTIGEN: Hepatitis B Surface Ag: NEGATIVE

## 2012-10-30 ENCOUNTER — Inpatient Hospital Stay (HOSPITAL_COMMUNITY)
Admission: AD | Admit: 2012-10-30 | Discharge: 2012-10-30 | Disposition: A | Discharge status: Home / Self Care | Payer: Self-pay | Source: Ambulatory Visit | Attending: Obstetrics & Gynecology | Admitting: Obstetrics & Gynecology

## 2012-10-30 NOTE — MAU Note (Signed)
Patient is not in the lobby when called to triage.  

## 2012-10-30 NOTE — MAU Note (Signed)
Pt not in lobby.  

## 2012-11-07 ENCOUNTER — Ambulatory Visit (HOSPITAL_COMMUNITY)
Admission: RE | Admit: 2012-11-07 | Discharge: 2012-11-07 | Disposition: A | Discharge status: Home / Self Care | Payer: Medicaid Other | Source: Ambulatory Visit | Attending: Nurse Practitioner | Admitting: Nurse Practitioner

## 2012-11-07 DIAGNOSIS — Z3689 Encounter for other specified antenatal screening: Secondary | ICD-10-CM

## 2012-11-07 DIAGNOSIS — Z363 Encounter for antenatal screening for malformations: Secondary | ICD-10-CM | POA: Insufficient documentation

## 2012-11-07 DIAGNOSIS — Z1389 Encounter for screening for other disorder: Secondary | ICD-10-CM | POA: Insufficient documentation

## 2012-11-07 DIAGNOSIS — O09299 Supervision of pregnancy with other poor reproductive or obstetric history, unspecified trimester: Secondary | ICD-10-CM | POA: Insufficient documentation

## 2012-11-07 DIAGNOSIS — O358XX0 Maternal care for other (suspected) fetal abnormality and damage, not applicable or unspecified: Secondary | ICD-10-CM | POA: Insufficient documentation

## 2012-11-28 ENCOUNTER — Emergency Department (HOSPITAL_COMMUNITY)
Admission: EM | Admit: 2012-11-28 | Discharge: 2012-11-29 | Disposition: A | Discharge status: Home / Self Care | Payer: Medicaid Other | Attending: Emergency Medicine | Admitting: Emergency Medicine

## 2012-11-28 ENCOUNTER — Encounter (HOSPITAL_COMMUNITY): Payer: Self-pay | Admitting: Emergency Medicine

## 2012-11-28 DIAGNOSIS — Z8619 Personal history of other infectious and parasitic diseases: Secondary | ICD-10-CM | POA: Insufficient documentation

## 2012-11-28 DIAGNOSIS — Y9389 Activity, other specified: Secondary | ICD-10-CM | POA: Insufficient documentation

## 2012-11-28 DIAGNOSIS — O99330 Smoking (tobacco) complicating pregnancy, unspecified trimester: Secondary | ICD-10-CM | POA: Insufficient documentation

## 2012-11-28 DIAGNOSIS — S060X9A Concussion with loss of consciousness of unspecified duration, initial encounter: Secondary | ICD-10-CM | POA: Insufficient documentation

## 2012-11-28 DIAGNOSIS — O9989 Other specified diseases and conditions complicating pregnancy, childbirth and the puerperium: Secondary | ICD-10-CM | POA: Insufficient documentation

## 2012-11-28 DIAGNOSIS — W1809XA Striking against other object with subsequent fall, initial encounter: Secondary | ICD-10-CM | POA: Insufficient documentation

## 2012-11-28 DIAGNOSIS — E876 Hypokalemia: Secondary | ICD-10-CM | POA: Insufficient documentation

## 2012-11-28 DIAGNOSIS — O99019 Anemia complicating pregnancy, unspecified trimester: Secondary | ICD-10-CM | POA: Insufficient documentation

## 2012-11-28 DIAGNOSIS — R51 Headache: Secondary | ICD-10-CM | POA: Insufficient documentation

## 2012-11-28 DIAGNOSIS — R55 Syncope and collapse: Secondary | ICD-10-CM | POA: Insufficient documentation

## 2012-11-28 DIAGNOSIS — Y929 Unspecified place or not applicable: Secondary | ICD-10-CM | POA: Insufficient documentation

## 2012-11-28 DIAGNOSIS — S139XXA Sprain of joints and ligaments of unspecified parts of neck, initial encounter: Secondary | ICD-10-CM | POA: Insufficient documentation

## 2012-11-28 DIAGNOSIS — D649 Anemia, unspecified: Secondary | ICD-10-CM

## 2012-11-28 NOTE — ED Notes (Signed)
Denies blurred vision. Reports seeing black spots. Denies nausea/emesis.

## 2012-11-28 NOTE — ED Notes (Signed)
EMS stated pt apartment smelled of marijuana. Pt denies drug abuse.

## 2012-11-28 NOTE — ED Notes (Signed)
Report given via EMS. Pt c/o positive LOC at 2230. Unwitnessed. Pt had been moving prior and got dizzy. Pt bent over to pick something up, stood up, and had a syncopal episode. Larey Seat, hit back of head, small hematoma to occipital region. No crepitus, no bleeding. Initially no neck or back pain. Pt started complained about lumbar back pain on ED arrival. No crepitus, no deformity noted to spine. Core cord assessment was normal. CBG 96. BP 121/84 HR 95 SpO2 100% RA RR 16 at 2330. No abdominal pain. No n/v. No masses. No guarding or rigidity. Pt photosensitive. 5 months pregnant. Para 1 Gravida 6 A 4(3 elective).

## 2012-11-29 ENCOUNTER — Emergency Department (HOSPITAL_COMMUNITY): Payer: Medicaid Other

## 2012-11-29 ENCOUNTER — Encounter (HOSPITAL_COMMUNITY): Payer: Self-pay | Admitting: Radiology

## 2012-11-29 LAB — POCT I-STAT, CHEM 8
Calcium, Ion: 1.16 mmol/L (ref 1.12–1.23)
Hemoglobin: 7.8 g/dL — ABNORMAL LOW (ref 12.0–15.0)
Sodium: 138 mEq/L (ref 135–145)
TCO2: 19 mmol/L (ref 0–100)

## 2012-11-29 MED ORDER — POTASSIUM CHLORIDE CRYS ER 20 MEQ PO TBCR
40.0000 meq | EXTENDED_RELEASE_TABLET | Freq: Once | ORAL | Status: AC
  Administered 2012-11-29: 40 meq via ORAL
  Filled 2012-11-29: qty 2

## 2012-11-29 NOTE — ED Notes (Signed)
Informed Dr. Fonnie Jarvis about LOC and pregnancy.

## 2012-11-29 NOTE — ED Notes (Signed)
EKG given to EDP, Fonnie Jarvis, MD.

## 2012-11-29 NOTE — ED Notes (Signed)
Per RN, Eden Emms, hold orthostatic vital signs.

## 2012-11-29 NOTE — ED Provider Notes (Signed)
History     CSN: [id number]  Arrival date & time 11/28/12  2346   First MD Initiated Contact with Patient 11/29/12 0001      Chief Complaint  Patient presents with  . Loss of Consciousness  . Pregnant    OB [place]  (Consider location/radiation/quality/duration/timing/severity/associated sxs/prior treatment) HPI This 22 year old female bent over to pick something up and stood up and felt lightheaded and woke up on the floor having hit the back of her head and now has a severe headache with severe neck pain. She is no change in speech vision swallowing or understanding no focal weakness or numbness or incoordination. She is no chest pain palpitations shortness of breath back pain abdominal pain vaginal bleeding or other concerns. There is no abdominal trauma. Treatment prior to arrival consists of cervical collar and backboard from EMS. This was an unwitnessed syncopal event that occurred just prior to arrival. She is gravida 6 para 1 estimated gestational age [redacted] weeks with due date 04/08/2013. Patient states she has a history of severe anemia at baseline.  Past Medical History  Diagnosis Date  . Anemia   . Chlamydia infection     Past Surgical History  Procedure Laterality Date  . Appendectomy      No family history on file.  History  Substance Use Topics  . Smoking status: Current Some Day Smoker  . Smokeless tobacco: Not on file  . Alcohol Use: Yes    OB History   Grav Para Term Preterm Abortions TAB SAB Ect Mult Living   7 1 1  0 4 3 1  0 0 1      Review of Systems 10 Systems reviewed and are negative for acute change except as noted in the HPI. Allergies  Review of patient's allergies indicates no known allergies.  Home Medications   Current Outpatient Rx  Name  Route  Sig  Dispense  Refill  . acetaminophen (TYLENOL) 500 MG tablet   Oral   Take 500 mg by mouth every 6 (six) hours as needed for pain.         . Prenatal Vit-Fe Fumarate-FA (PRENATAL  MULTIVITAMIN) TABS   Oral   Take 1 tablet by mouth every morning.           BP 123/82  Pulse 97  Temp(Src) 98.4 F (36.9 C) (Oral)  Resp 16  SpO2 100%  LMP 04/26/2012  Physical Exam  Nursing note and vitals reviewed. Constitutional:  Awake, alert, nontoxic appearance with baseline speech for patient.  HENT:  Mouth/Throat: No oropharyngeal exudate.  Occipital scalp tender  Eyes: EOM are normal. Pupils are equal, round, and reactive to light. Right eye exhibits no discharge. Left eye exhibits no discharge.  Neck: Neck supple.  Cervical spine precautions maintained the patient has diffuse midline cervical and paracervical tenderness  Cardiovascular: Normal rate and regular rhythm.   No murmur heard. Pulmonary/Chest: Effort normal and breath sounds normal. No stridor. No respiratory distress. She has no wheezes. She has no rales. She exhibits no tenderness.  Abdominal: Soft. Bowel sounds are normal. She exhibits no distension. There is no tenderness. There is no rebound and no guarding.  Patient has a nontender gravid uterus to the umbilicus with limited bedside emergency department ultrasound revealing heart rate 160 with approximate biparietal diameter consistent with 22 weeks estimated gestational age  Musculoskeletal: She exhibits no tenderness.  Baseline ROM, moves extremities with no obvious new focal weakness. Back is nontender.  Lymphadenopathy:    She has  no cervical adenopathy.  Neurological:  Awake, alert, cooperative and aware of situation; motor strength 5/5 bilaterally; sensation normal to light touch bilaterally; peripheral visual Serpas full to confrontation; no facial asymmetry; tongue midline; major cranial nerves appear intact; no pronator drift, normal finger to nose bilaterally  Skin: No rash noted.  Psychiatric: She has a normal mood and affect.    ED Course  Procedures (including critical care time) ECG: Sinus rhythm, ventricular rate 87, normal axis, RSR  prime in lead V1 with QRS duration 86 ms, no ST elevation in leads V1 or V2 to suggest Brugada pattern, no prolonged QT interval, no comparison ECG immediately available   No answer from Dr. Marcia Brash service, so case d/w Dr. Emelda Fear Faculty OB agrees with no apparent need for toco/fetal monitoring. 0055   Labs Reviewed  POCT I-STAT, CHEM 8 - Abnormal; Notable for the following:    Potassium 3.0 (*)    Hemoglobin 7.8 (*)    HCT 23.0 (*)    All other components within normal limits   No results found.   1. Syncope   2. Minor head injury with loss of consciousness   3. Cervical strain   4. Pregnancy   5. Anemia   6. Hypokalemia       MDM  Pt stable in ED with no significant deterioration in condition.  Patient / Family / Caregiver informed of clinical course, understand medical decision-making process, and agree with plan.  I doubt any other EMC precluding discharge at this time including, but not necessarily limited to the following:ICH, CSI.        Hurman Horn, MD 12/08/12 [phone]

## 2012-12-23 ENCOUNTER — Encounter (HOSPITAL_COMMUNITY): Payer: Self-pay | Admitting: *Deleted

## 2012-12-23 ENCOUNTER — Inpatient Hospital Stay (HOSPITAL_COMMUNITY)
Admission: AD | Admit: 2012-12-23 | Discharge: 2012-12-23 | Disposition: A | Discharge status: Home / Self Care | Payer: Medicaid Other | Source: Ambulatory Visit | Attending: Obstetrics & Gynecology | Admitting: Obstetrics & Gynecology

## 2012-12-23 DIAGNOSIS — O99019 Anemia complicating pregnancy, unspecified trimester: Secondary | ICD-10-CM | POA: Insufficient documentation

## 2012-12-23 DIAGNOSIS — D649 Anemia, unspecified: Secondary | ICD-10-CM | POA: Insufficient documentation

## 2012-12-23 DIAGNOSIS — O99891 Other specified diseases and conditions complicating pregnancy: Secondary | ICD-10-CM | POA: Insufficient documentation

## 2012-12-23 DIAGNOSIS — R109 Unspecified abdominal pain: Secondary | ICD-10-CM | POA: Insufficient documentation

## 2012-12-23 LAB — URINALYSIS, ROUTINE W REFLEX MICROSCOPIC
Glucose, UA: NEGATIVE mg/dL
Hgb urine dipstick: NEGATIVE
Ketones, ur: NEGATIVE mg/dL
Leukocytes, UA: NEGATIVE
Protein, ur: NEGATIVE mg/dL
Urobilinogen, UA: 0.2 mg/dL (ref 0.0–1.0)

## 2012-12-23 LAB — WET PREP, GENITAL
Trich, Wet Prep: NONE SEEN
Yeast Wet Prep HPF POC: NONE SEEN

## 2012-12-23 LAB — CBC
Hemoglobin: 6.8 g/dL — CL (ref 12.0–15.0)
MCH: 20.6 pg — ABNORMAL LOW (ref 26.0–34.0)
WBC: 9.1 10*3/uL (ref 4.0–10.5)

## 2012-12-23 MED ORDER — FERROUS GLUCONATE 216 MG PO TABS: 216.0000 mg | ORAL_TABLET | Freq: Three times a day (TID) | ORAL | Status: DC

## 2012-12-23 NOTE — MAU Provider Note (Signed)
History   22 y/o [id number] at [redacted]w[redacted]d presenting with lower abdomina pain  CSN: [id number]  Arrival date and time: 12/23/12 1923   None     No chief complaint on file.  Abdominal Pain This is a new problem. The current episode started today. The onset quality is gradual. The problem occurs constantly. The most recent episode lasted 12 hours. The problem has been unchanged. The pain is located in the suprapubic region. The pain is at a severity of 8/10. The pain is moderate. The quality of the pain is cramping. The abdominal pain radiates to the back. Pertinent negatives include no anorexia, arthralgias, constipation, diarrhea, dysuria, fever, frequency, headaches, myalgias, nausea or vomiting. Nothing aggravates the pain. The pain is relieved by nothing. She has tried acetaminophen for the symptoms. The treatment provided mild relief. Her past medical history is significant for abdominal surgery (Appendectomy). There is no history of gallstones.    OB History   Grav Para Term Preterm Abortions TAB SAB Ect Mult Living   6 1 1  0 4 3 1  0 0 1      Past Medical History  Diagnosis Date  . Anemia   . Chlamydia infection     Past Surgical History  Procedure Laterality Date  . Appendectomy      History reviewed. No pertinent family history.  History  Substance Use Topics  . Smoking status: Current Some Day Smoker  . Smokeless tobacco: Not on file  . Alcohol Use: Yes    Allergies: No Known Allergies  Prescriptions prior to admission  Medication Sig Dispense Refill  . acetaminophen (TYLENOL) 500 MG tablet Take 500 mg by mouth every 6 (six) hours as needed for pain. Stomach pain      . Prenatal Vit-Fe Fumarate-FA (PRENATAL MULTIVITAMIN) TABS Take 1 tablet by mouth every morning.        Review of Systems  Constitutional: Negative for fever and chills.  Eyes: Negative for blurred vision and double vision.  Respiratory: Negative for cough and wheezing.   Cardiovascular: Negative  for chest pain, palpitations and leg swelling.  Gastrointestinal: Positive for abdominal pain. Negative for nausea, vomiting, diarrhea, constipation and anorexia.  Genitourinary: Negative for dysuria, urgency and frequency.  Musculoskeletal: Negative for myalgias, joint pain and arthralgias.  Skin: Negative for itching and rash.  Neurological: Negative for headaches.  Endo/Heme/Allergies: Negative for environmental allergies. Does not bruise/bleed easily.  All other systems reviewed and are negative.   Physical Exam   Blood pressure 120/65, pulse 80, temperature 98 F (36.7 C), temperature source Oral, resp. rate 18, height 5\' 3"  (1.6 m), weight 83.122 kg (183 lb 4 oz), last menstrual period 04/26/2012.  Physical Exam  Vitals reviewed. Constitutional: She is oriented to person, place, and time. She appears well-developed and well-nourished.  HENT:  Head: Normocephalic and atraumatic.  Eyes: EOM are normal. Pupils are equal, round, and reactive to light.  Neck: Normal range of motion. Neck supple.  Cardiovascular: Normal rate, regular rhythm, normal heart sounds and intact distal pulses.  Exam reveals no gallop and no friction rub.   No murmur heard. Respiratory: Effort normal and breath sounds normal. No respiratory distress. She has no wheezes. She has no rales.  GI: Soft. Bowel sounds are normal. There is no tenderness. There is CVA tenderness (R > L). There is no rebound and no guarding.  Genitourinary: Vagina normal. Pelvic exam was performed with patient prone.  Sterile speculum exam: moderate white/green vaginal discharge, cervix closed on visual exam  Musculoskeletal: Normal range of motion. She exhibits no edema and no tenderness.  Neurological: She is alert and oriented to person, place, and time.  Skin: Skin is warm and dry.  Psychiatric: She has a normal mood and affect. Her behavior is normal.   NST: Baseline 150, accelerations present, no deceration, moderate  variability, no contractions  Results for orders placed during the hospital encounter of 12/23/12 (from the past 24 hour(s))  URINALYSIS, ROUTINE W REFLEX MICROSCOPIC     Status: None   Collection Time    12/23/12  7:59 PM      Result Value Range   Color, Urine YELLOW  YELLOW   APPearance CLEAR  CLEAR   Specific Gravity, Urine 1.025  1.005 - 1.030   pH 6.0  5.0 - 8.0   Glucose, UA NEGATIVE  NEGATIVE mg/dL   Hgb urine dipstick NEGATIVE  NEGATIVE   Bilirubin Urine NEGATIVE  NEGATIVE   Ketones, ur NEGATIVE  NEGATIVE mg/dL   Protein, ur NEGATIVE  NEGATIVE mg/dL   Urobilinogen, UA 0.2  0.0 - 1.0 mg/dL   Nitrite NEGATIVE  NEGATIVE   Leukocytes, UA NEGATIVE  NEGATIVE  CBC     Status: Abnormal   Collection Time    12/23/12  8:20 PM      Result Value Range   WBC 9.1  4.0 - 10.5 K/uL   RBC 3.30 (*) 3.87 - 5.11 MIL/uL   Hemoglobin 6.8 (*) 12.0 - 15.0 g/dL   HCT 16.1 (*) 09.6 - 04.5 %   MCV 67.3 (*) 78.0 - 100.0 fL   MCH 20.6 (*) 26.0 - 34.0 pg   MCHC 30.6  30.0 - 36.0 g/dL   RDW 40.9 (*) 81.1 - 91.4 %   Platelets 206  150 - 400 K/uL  WET PREP, GENITAL     Status: Abnormal   Collection Time    12/23/12 10:00 PM      Result Value Range   Yeast Wet Prep HPF POC NONE SEEN  NONE SEEN   Trich, Wet Prep NONE SEEN  NONE SEEN   Clue Cells Wet Prep HPF POC FEW (*) NONE SEEN   WBC, Wet Prep HPF POC MANY (*) NONE SEEN    MAU Course  Procedures  MDM   Assessment and Plan  21 y Val Eagle [id number] at [redacted]w[redacted]d presenting with lower abdominal pain for 1 day  # Round ligament pain: Pt mentions 1 day of new lower abdomina pain without contractions, vaginal itching/ burning or discharge that radiates to her back with slight improvement with Tylenol.  Differential: Bacterial Vaginosis: pt denies increased vaginal discharge/ itching or burning, with few clue cells on wet prep -- Tylenol prn for pain -- warm cloth over back prn for pain  # Anemia: Pt reports hx of anemia tx w/ iron supplements, but admits  to being non-compliant with medication.   -- pt encouraged to start taking iron supplements -- plan for f/u in health department for continued management.   Gregor Hams 12/23/2012, 10:38 PM   I have seen and examined this patient and I agree with the above. FHR reassuring for GA; no ctx per Coral Ceo, Calvin Chura 11:43 PM 12/23/2012

## 2012-12-23 NOTE — MAU Note (Signed)
PT SAYS  AT 10 AM - SHE WAS AT WORK-  AT [employer]- HER LOWER ABD STARTED HURTING. SHE SAT FOR A WHILE - THEN DRANK WATER.  SHE DID NOT CALL HER DR. HAS CONTINUED TO GET WORSE  AND WORSE NOW.  LAST SEX-  Friday.  OK AT HD- FOR [facility].  Marland Kitchen  DENIES HSV AND MRSA.

## 2012-12-23 NOTE — MAU Note (Signed)
CRITICAL VALUE ALERT  Critical value received:  Hgb 6.8  Date of notification: December 23, 2012  Time of notification:  2100  Critical value read back:yes  Nurse who received alert:  Venia Carbon  MD notified (1st page):  Dr. Anastasia Fiedler   Time of first page: 2100  MD notified (2nd page):  Time of second page:  Responding MD:   Time MD responded: 2100

## 2012-12-24 NOTE — MAU Provider Note (Signed)
Attestation of Attending Supervision of Advanced Practitioner (PA/CNM/NP): Evaluation and management procedures were performed by the Advanced Practitioner under my supervision and collaboration.  I have reviewed the Advanced Practitioner's note and chart, and I agree with the management and plan.  Roosvelt Churchwell, MD, FACOG Attending Obstetrician & Gynecologist Faculty Practice, [facility]  

## 2013-01-10 ENCOUNTER — Inpatient Hospital Stay (HOSPITAL_COMMUNITY)
Admission: AD | Admit: 2013-01-10 | Discharge: 2013-01-10 | Disposition: A | Discharge status: Home / Self Care | Payer: Medicaid Other | Source: Ambulatory Visit | Attending: Family Medicine | Admitting: Family Medicine

## 2013-01-10 ENCOUNTER — Encounter (HOSPITAL_COMMUNITY): Payer: Self-pay | Admitting: *Deleted

## 2013-01-10 DIAGNOSIS — N899 Noninflammatory disorder of vagina, unspecified: Secondary | ICD-10-CM

## 2013-01-10 DIAGNOSIS — O2390 Unspecified genitourinary tract infection in pregnancy, unspecified trimester: Secondary | ICD-10-CM | POA: Insufficient documentation

## 2013-01-10 DIAGNOSIS — A499 Bacterial infection, unspecified: Secondary | ICD-10-CM | POA: Insufficient documentation

## 2013-01-10 DIAGNOSIS — Z3480 Encounter for supervision of other normal pregnancy, unspecified trimester: Secondary | ICD-10-CM

## 2013-01-10 DIAGNOSIS — B9689 Other specified bacterial agents as the cause of diseases classified elsewhere: Secondary | ICD-10-CM | POA: Insufficient documentation

## 2013-01-10 DIAGNOSIS — N949 Unspecified condition associated with female genital organs and menstrual cycle: Secondary | ICD-10-CM | POA: Insufficient documentation

## 2013-01-10 DIAGNOSIS — L293 Anogenital pruritus, unspecified: Secondary | ICD-10-CM | POA: Insufficient documentation

## 2013-01-10 DIAGNOSIS — N760 Acute vaginitis: Secondary | ICD-10-CM | POA: Insufficient documentation

## 2013-01-10 LAB — WET PREP, GENITAL: Yeast Wet Prep HPF POC: NONE SEEN

## 2013-01-10 MED ORDER — ACYCLOVIR 400 MG PO TABS: 400.0000 mg | ORAL_TABLET | Freq: Three times a day (TID) | ORAL | Status: DC

## 2013-01-10 MED ORDER — METRONIDAZOLE 500 MG PO TABS: 500.0000 mg | ORAL_TABLET | Freq: Two times a day (BID) | ORAL | Status: DC

## 2013-01-10 NOTE — MAU Provider Note (Signed)
History     CSN: [id number]  Arrival date and time: 01/10/13 1403   None     Chief Complaint  Patient presents with  . vaginal irritation    HPI Sharon Conner is a 22 y.o. [phone] female at [redacted]w[redacted]d who presents with report of vaginal itching x 1 week with 'bumps' appearing yesterday, increased white non-odorous vag d/c as well.  Denies h/o STIs. Uses condoms w/ current partner. Changed soap last week at app same time as symptoms began, but no itching/bumps on any other part of body.  Reports good fm.  Denies vb, lof, or uc's.  Next appt at [facility] on 4/15.   OB History   Grav Para Term Preterm Abortions TAB SAB Ect Mult Living   6 1 1  0 4 3 1  0 0 1      Past Medical History  Diagnosis Date  . Anemia   . Chlamydia infection     Past Surgical History  Procedure Laterality Date  . Appendectomy      History reviewed. No pertinent family history.  History  Substance Use Topics  . Smoking status: Former Smoker    Quit date: 07/09/2012  . Smokeless tobacco: Not on file  . Alcohol Use: No    Allergies: No Known Allergies  Prescriptions prior to admission  Medication Sig Dispense Refill  . acetaminophen (TYLENOL) 500 MG tablet Take 500 mg by mouth every 6 (six) hours as needed for pain. Stomach pain      . ferrous gluconate (FERGON) 216 MG tablet Take 1 tablet (216 mg total) by mouth 3 (three) times daily with meals.  90 tablet  3  . Prenatal Vit-Fe Fumarate-FA (PRENATAL MULTIVITAMIN) TABS Take 1 tablet by mouth every morning.        Review of Systems  Constitutional: Negative.   HENT: Negative.   Eyes: Negative.   Respiratory: Negative.   Cardiovascular: Negative.   Gastrointestinal: Negative.   Genitourinary: Negative for dysuria, urgency, frequency, hematuria and flank pain.       Vaginal itching x 1 wk and 'bumps' since yest  Musculoskeletal: Negative.   Skin: Negative.   Neurological: Negative.   Endo/Heme/Allergies: Negative.   Psychiatric/Behavioral:  Negative.    Physical Exam   Blood pressure 132/72, pulse 90, temperature 98.2 F (36.8 C), temperature source Oral, resp. rate 18, last menstrual period 04/26/2012.  Physical Exam  Constitutional: She is oriented to person, place, and time. She appears well-developed and well-nourished.  HENT:  Head: Normocephalic.  Neck: Normal range of motion.  Cardiovascular: Normal rate.   Respiratory: Effort normal.  GI: Soft.  gravid  Genitourinary:  Ext genitalia: multiple small ulcerated lesions on left labia majora and minora, as well as small one on Rt clitoral hood. HSVII culture obtained.  Spec exam: Mod amount thin yellowish-white homogenous malodorous d/c.  Wet prep, gc/ch obtained.  Musculoskeletal: Normal range of motion.  Neurological: She is alert and oriented to person, place, and time.  Skin: Skin is warm and dry.  Psychiatric: She has a normal mood and affect. Her behavior is normal. Judgment and thought content normal.   FHR: 145, mod variability, 15x15accels, no decels=Cat I UCs: none  MAU Course  Procedures  MDM Physical exam Spec exam w/ wet prep, gc/ch, HSVII culture  Results for orders placed during the hospital encounter of 01/10/13 (from the past 24 hour(s))  WET PREP, GENITAL     Status: Abnormal   Collection Time    01/10/13  3:40  PM      Result Value Range   Yeast Wet Prep HPF POC NONE SEEN  NONE SEEN   Trich, Wet Prep NONE SEEN  NONE SEEN   Clue Cells Wet Prep HPF POC FEW (*) NONE SEEN   WBC, Wet Prep HPF POC MODERATE BACTERIA SEEN (*) NONE SEEN   Follow-up Information   Follow up with [facility] On 01/20/2013. (as scheduled)    Contact information:   [address]  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

## 2013-01-10 NOTE — MAU Provider Note (Signed)
Chart reviewed and agree with management and plan.  

## 2013-01-10 NOTE — Progress Notes (Signed)
Selena Batten, CNM in room to see pt. Cultures obtained.

## 2013-01-10 NOTE — Progress Notes (Signed)
Kim called and notified of pt. Ready to be seen.

## 2013-01-10 NOTE — MAU Note (Signed)
Here for vaginal irritation and new bumps. Denies new partners. Used a latex condom last week for the first time in a long time and noticed symptoms after this. Did start using a new soap around the same time as well. Notes white discharge as well.

## 2013-01-10 NOTE — MAU Note (Signed)
Pt presents with complaints of vaginal irritation that started about a week ago and has noticed some little bumps in her vaginal area. States that she has used a different soap recently.

## 2013-01-11 LAB — GC/CHLAMYDIA PROBE AMP: CT Probe RNA: NEGATIVE

## 2013-01-12 LAB — HERPES SIMPLEX VIRUS CULTURE: Culture: NOT DETECTED

## 2013-03-06 ENCOUNTER — Encounter: Payer: Self-pay | Admitting: *Deleted

## 2013-03-07 ENCOUNTER — Encounter (HOSPITAL_COMMUNITY): Payer: Self-pay

## 2013-03-07 ENCOUNTER — Inpatient Hospital Stay (HOSPITAL_COMMUNITY)
Admission: AD | Admit: 2013-03-07 | Discharge: 2013-03-08 | Disposition: A | Discharge status: Home / Self Care | Payer: Medicaid Other | Source: Ambulatory Visit | Attending: Obstetrics & Gynecology | Admitting: Obstetrics & Gynecology

## 2013-03-07 DIAGNOSIS — R109 Unspecified abdominal pain: Secondary | ICD-10-CM | POA: Insufficient documentation

## 2013-03-07 DIAGNOSIS — O4703 False labor before 37 completed weeks of gestation, third trimester: Secondary | ICD-10-CM

## 2013-03-07 DIAGNOSIS — O4700 False labor before 37 completed weeks of gestation, unspecified trimester: Secondary | ICD-10-CM | POA: Insufficient documentation

## 2013-03-07 DIAGNOSIS — N949 Unspecified condition associated with female genital organs and menstrual cycle: Secondary | ICD-10-CM | POA: Insufficient documentation

## 2013-03-07 LAB — URINALYSIS, ROUTINE W REFLEX MICROSCOPIC
Bilirubin Urine: NEGATIVE
Glucose, UA: NEGATIVE mg/dL
Hgb urine dipstick: NEGATIVE
Ketones, ur: 15 mg/dL — AB
Nitrite: NEGATIVE
Protein, ur: 100 mg/dL — AB
Specific Gravity, Urine: >1.03 — ABNORMAL HIGH (ref 1.005–1.030)
Urobilinogen, UA: 0.2 mg/dL (ref 0.0–1.0)
pH: 6 (ref 5.0–8.0)

## 2013-03-07 LAB — URINE MICROSCOPIC-ADD ON

## 2013-03-07 NOTE — MAU Note (Signed)
Patient is in with c/o new onset lower abdominal /pelvic pain that started at 1730pm. She denies vaginal bleeding or lof. She reports good fetal movement. She is asking if she can get an ultrasound today to make sure everything is fine with her baby.

## 2013-03-07 NOTE — MAU Provider Note (Signed)
History     CSN: [id number]  Arrival date and time: 03/07/13 2114   None     Chief Complaint  Patient presents with  . Abdominal Pain  . Pelvic Pain   HPI  Pt is a [id number] at 35.3 wks IUP with report of lower pelvic pain that started around 2000.  Pt reports unsure if contractions, but pain intermittent in nature.  No recent intercourse, leaking of fluid, or vaginal bleeding.  +fetal movement.  Denies problems in current pregnancy. Reports urinary frequency and urgency. Denies dysuria and hesitancy.   Past Medical History  Diagnosis Date  . Anemia   . Chlamydia infection 2013    Past Surgical History  Procedure Laterality Date  . Appendectomy      History reviewed. No pertinent family history.  History  Substance Use Topics  . Smoking status: Former Smoker    Quit date: 07/09/2012  . Smokeless tobacco: Not on file  . Alcohol Use: No    Allergies: No Known Allergies  Prescriptions prior to admission  Medication Sig Dispense Refill  . acetaminophen (TYLENOL) 500 MG tablet Take 500 mg by mouth every 6 (six) hours as needed for pain. Stomach pain      . ferrous gluconate (FERGON) 216 MG tablet Take 1 tablet (216 mg total) by mouth 3 (three) times daily with meals.  90 tablet  3  . Prenatal Vit-Fe Fumarate-FA (PRENATAL MULTIVITAMIN) TABS Take 1 tablet by mouth every morning.        Review of Systems  Constitutional: Negative.  Negative for fever.  HENT: Negative.   Eyes: Negative.   Respiratory: Negative.   Cardiovascular: Negative.   Gastrointestinal: Positive for abdominal pain (lower pelvic).  Genitourinary: Positive for urgency and frequency (x 2wks). Negative for dysuria, hematuria and flank pain.  Musculoskeletal: Negative.   Skin: Negative.   Neurological: Negative.   Endo/Heme/Allergies: Negative.   Psychiatric/Behavioral: Negative.   All other systems reviewed and are negative.   Physical Exam   Blood pressure 111/84, pulse 110, temperature 99.2  F (37.3 C), temperature source Oral, resp. rate 18, last menstrual period 04/26/2012.  Physical Exam  Constitutional: She is oriented to person, place, and time. She appears well-developed and well-nourished. No distress.  HENT:  Head: Normocephalic.  Neck: Normal range of motion. Neck supple.  Cardiovascular: Normal rate, regular rhythm and normal heart sounds.   Respiratory: Effort normal and breath sounds normal.  GI: Soft. There is no tenderness.  Genitourinary: No bleeding around the vagina. Vaginal discharge (mucusy) found.  Musculoskeletal: Normal range of motion.  Neurological: She is alert and oriented to person, place, and time.  Skin: Skin is warm and dry.  Psychiatric: She has a normal mood and affect. Her behavior is normal. Judgment and thought content normal.  Dilation: 1 Effacement (%): Thick Cervical Position: Posterior Station: Ballotable Presentation: Vertex Exam by:: Roney Marion, CNM   MAU Course  Procedures UA, C&S NST SVE x 3  Report given to K. Dejanira Pamintuan who assumes care of patient.  I assumed care at app 2318:  FHR: 125, mod variability, 15x15accels, no decels=Cat I UCs q 2-32min  SVE 1hr after 1st exam: 1.5/70/-2, more midline, vtx 1hr later: no cervical change Observed x 3.5hrs w/ only minimal cervical change  Assessment and Plan  A:  [redacted]w[redacted]d SIUP  Cat I FHR  Preterm uc's w/ initial cervical change, then none   P:  D/C home  Keep next appt at [facility] as scheduled on Monday  Reviewed  ptl s/s and fetal kick counts  Reviewed reasons to return to hospital  Increase po hydration  Rx: keflex 500mg  qid x 7d for suspected uti    [facility] 03/07/2013, 10:08 PM

## 2013-03-08 DIAGNOSIS — O4700 False labor before 37 completed weeks of gestation, unspecified trimester: Secondary | ICD-10-CM

## 2013-03-08 DIAGNOSIS — N949 Unspecified condition associated with female genital organs and menstrual cycle: Secondary | ICD-10-CM

## 2013-03-08 DIAGNOSIS — R109 Unspecified abdominal pain: Secondary | ICD-10-CM

## 2013-03-08 MED ORDER — CEPHALEXIN 500 MG PO CAPS: 500.0000 mg | ORAL_CAPSULE | Freq: Four times a day (QID) | ORAL | Status: DC

## 2013-03-08 NOTE — MAU Provider Note (Signed)
Attestation of Attending Supervision of Advanced Practitioner (CNM/NP): Evaluation and management procedures were performed by the Advanced Practitioner under my supervision and collaboration.  I have reviewed the Advanced Practitioner's note and chart, and I agree with the management and plan.  HARRAWAY-SMITH, Aaira Oestreicher 3:14 AM

## 2013-03-09 LAB — URINE CULTURE: Colony Count: 40000

## 2013-03-19 LAB — OB RESULTS CONSOLE GBS: GBS: NEGATIVE

## 2013-03-22 ENCOUNTER — Encounter (HOSPITAL_COMMUNITY): Payer: Self-pay | Admitting: *Deleted

## 2013-03-22 ENCOUNTER — Inpatient Hospital Stay (HOSPITAL_COMMUNITY)
Admission: AD | Admit: 2013-03-22 | Discharge: 2013-03-25 | DRG: 775 | Disposition: A | Discharge status: Home / Self Care | Payer: Medicaid Other | Source: Ambulatory Visit | Attending: Obstetrics and Gynecology | Admitting: Obstetrics and Gynecology

## 2013-03-22 DIAGNOSIS — N760 Acute vaginitis: Secondary | ICD-10-CM

## 2013-03-22 DIAGNOSIS — O4290 Premature rupture of membranes, unspecified as to length of time between rupture and onset of labor, unspecified weeks of gestation: Principal | ICD-10-CM | POA: Diagnosis present

## 2013-03-22 DIAGNOSIS — D509 Iron deficiency anemia, unspecified: Secondary | ICD-10-CM | POA: Diagnosis present

## 2013-03-22 DIAGNOSIS — O328XX0 Maternal care for other malpresentation of fetus, not applicable or unspecified: Secondary | ICD-10-CM | POA: Diagnosis present

## 2013-03-22 DIAGNOSIS — O9902 Anemia complicating childbirth: Secondary | ICD-10-CM | POA: Diagnosis present

## 2013-03-22 DIAGNOSIS — Z3483 Encounter for supervision of other normal pregnancy, third trimester: Secondary | ICD-10-CM

## 2013-03-22 LAB — CBC
HCT: 21.2 % — ABNORMAL LOW (ref 36.0–46.0)
Hemoglobin: 6.1 g/dL — CL (ref 12.0–15.0)
MCHC: 28.8 g/dL — ABNORMAL LOW (ref 30.0–36.0)
MCV: 66.9 fL — ABNORMAL LOW (ref 78.0–100.0)
RDW: 22.2 % — ABNORMAL HIGH (ref 11.5–15.5)

## 2013-03-22 LAB — COMPREHENSIVE METABOLIC PANEL
ALT: 6 U/L (ref 0–35)
BUN: 7 mg/dL (ref 6–23)
Calcium: 9.2 mg/dL (ref 8.4–10.5)
Chloride: 104 mEq/L (ref 96–112)
Creatinine, Ser: 0.49 mg/dL — ABNORMAL LOW (ref 0.50–1.10)
GFR calc Af Amer: >90 mL/min (ref >90)
Glucose, Bld: 78 mg/dL (ref 70–99)
Sodium: 135 mEq/L (ref 135–145)
Total Protein: 6.1 g/dL (ref 6.0–8.3)

## 2013-03-22 LAB — TYPE AND SCREEN
ABO/RH(D): O POS
Antibody Screen: NEGATIVE

## 2013-03-22 LAB — PROTEIN / CREATININE RATIO, URINE
Creatinine, Urine: 29.73 mg/dL
Protein Creatinine Ratio: 0.3 — ABNORMAL HIGH (ref 0.00–0.15)
Total Protein, Urine: 9 mg/dL

## 2013-03-22 MED ORDER — TERBUTALINE SULFATE 1 MG/ML IJ SOLN: 0.2500 mg | Freq: Once | INTRAMUSCULAR | Status: AC | PRN

## 2013-03-22 MED ORDER — IBUPROFEN 600 MG PO TABS
600.0000 mg | ORAL_TABLET | Freq: Four times a day (QID) | ORAL | Status: DC | PRN
  Administered 2013-03-23: 600 mg via ORAL
  Filled 2013-03-22: qty 1

## 2013-03-22 MED ORDER — FERROUS GLUCONATE 324 (38 FE) MG PO TABS
324.0000 mg | ORAL_TABLET | Freq: Three times a day (TID) | ORAL | Status: DC
  Administered 2013-03-22 – 2013-03-23 (×2): 324 mg via ORAL
  Filled 2013-03-22 (×3): qty 1

## 2013-03-22 MED ORDER — OXYCODONE-ACETAMINOPHEN 5-325 MG PO TABS: 1.0000 | ORAL_TABLET | ORAL | Status: DC | PRN

## 2013-03-22 MED ORDER — ONDANSETRON HCL 4 MG/2ML IJ SOLN: 4.0000 mg | Freq: Four times a day (QID) | INTRAMUSCULAR | Status: DC | PRN

## 2013-03-22 MED ORDER — CITRIC ACID-SODIUM CITRATE 334-500 MG/5ML PO SOLN
30.0000 mL | ORAL | Status: DC | PRN
  Filled 2013-03-22: qty 15

## 2013-03-22 MED ORDER — LACTATED RINGERS IV SOLN
INTRAVENOUS | Status: DC
  Administered 2013-03-22 (×3): via INTRAVENOUS

## 2013-03-22 MED ORDER — LACTATED RINGERS IV SOLN: 500.0000 mL | INTRAVENOUS | Status: DC | PRN

## 2013-03-22 MED ORDER — OXYTOCIN BOLUS FROM INFUSION
500.0000 mL | INTRAVENOUS | Status: DC
  Administered 2013-03-23: 500 mL via INTRAVENOUS

## 2013-03-22 MED ORDER — CEPHALEXIN 500 MG PO CAPS: 500.0000 mg | ORAL_CAPSULE | Freq: Four times a day (QID) | ORAL | Status: DC

## 2013-03-22 MED ORDER — OXYTOCIN 40 UNITS IN LACTATED RINGERS INFUSION - SIMPLE MED
1.0000 m[IU]/min | INTRAVENOUS | Status: DC
  Administered 2013-03-23: 2 m[IU]/min via INTRAVENOUS

## 2013-03-22 MED ORDER — ACETAMINOPHEN 325 MG PO TABS: 650.0000 mg | ORAL_TABLET | ORAL | Status: DC | PRN

## 2013-03-22 MED ORDER — LIDOCAINE HCL (PF) 1 % IJ SOLN
30.0000 mL | INTRAMUSCULAR | Status: DC | PRN
  Filled 2013-03-22 (×2): qty 30

## 2013-03-22 MED ORDER — OXYTOCIN 40 UNITS IN LACTATED RINGERS INFUSION - SIMPLE MED
62.5000 mL/h | INTRAVENOUS | Status: DC
  Filled 2013-03-22: qty 1000

## 2013-03-22 NOTE — H&P (Signed)
Sharon Conner is a 22 y.o. female presenting for rupture of membranes at 0630 this AM.  Received prenatal care at [facility] Department. Maternal Medical History:  Reason for admission: Rupture of membranes.   Contractions: Onset was 1-2 hours ago.   Frequency: irregular.   Perceived severity is mild.    Fetal activity: Perceived fetal activity is normal.   Last perceived fetal movement was within the past hour.    Prenatal complications: Substance abuse (marijuana).   No bleeding.     OB History   Grav Para Term Preterm Abortions TAB SAB Ect Mult Living   6 1 1  0 4 3 1  0 0 1     Past Medical History  Diagnosis Date  . Anemia   . Chlamydia infection 2013   Past Surgical History  Procedure Laterality Date  . Appendectomy     Family History: family history is not on file. Social History:  reports that she quit smoking about 8 months ago. She does not have any smokeless tobacco history on file. She reports that she does not drink alcohol or use illicit drugs.   Prenatal Transfer Tool  Maternal Diabetes: No Genetic Screening: Normal Maternal Ultrasounds/Referrals: Normal Fetal Ultrasounds or other Referrals:  None Maternal Substance Abuse:  Yes:  Type: Marijuana Significant Maternal Medications:  None Significant Maternal Lab Results:  Lab values include: Group B Strep positive Other Comments:  Meconium stained fluid  Review of Systems  Eyes: Negative.   Gastrointestinal: Positive for abdominal pain (contractions).  Genitourinary:       Rupture of membranes  Neurological: Positive for headaches.  All other systems reviewed and are negative.      Blood pressure 133/89, pulse 71, temperature 98.1 F (36.7 C), temperature source Oral, resp. rate 18, height 5\' 3"  (1.6 m), weight 85.367 kg (188 lb 3.2 oz), last menstrual period 04/26/2012, SpO2 100.00%. Maternal Exam:  Uterine Assessment: Contraction strength is mild.  Contraction frequency is irregular.   Abdomen:  Patient reports no abdominal tenderness. Estimated fetal weight is 5.5-6lbs.   Fetal presentation: vertex  Introitus: Normal vulva. Vagina is positive for vaginal discharge (non-particulate meconium stained fluid).    Fetal Exam Fetal Monitor Review: Mode: fetoscope.   Baseline rate: 140's.  Variability: moderate (6-25 bpm).   Pattern: accelerations present.    Fetal State Assessment: Category I - tracings are normal.     Physical Exam  Constitutional: She is oriented to person, place, and time. She appears well-developed and well-nourished. No distress.  HENT:  Head: Normocephalic.  Neck: Normal range of motion. Neck supple.  Cardiovascular: Normal rate, regular rhythm and normal heart sounds.   Respiratory: Effort normal and breath sounds normal.  GI: Soft. She exhibits no distension.  Genitourinary: No bleeding around the vagina. Vaginal discharge (non-particulate meconium stained fluid) found.  Musculoskeletal: Normal range of motion. She exhibits edema. Tenderness: 1+ bilat pedal.  Neurological: She is alert and oriented to person, place, and time. She displays normal reflexes.  Skin: Skin is warm and dry.    Prenatal labs: ABO, Rh:   Antibody:   Rubella:   RPR:    HBsAg:    HIV:    GBS:     Assessment/Plan: Premature Rupture of Membranes Meconium Stained Fluid [id number] at 37.4 wk IUP by 7 wk Ultrasound  Borderline Blood Pressure  Plan: Admit to [facility] PreX labs Attempt Foley Bulb insertion  [facility] 03/22/2013, 7:41 AM

## 2013-03-22 NOTE — MAU Note (Signed)
Pt reports her water broke around 0620 this morning. Clear fluid  Out. C/o some ctx as well 2cm in office last week.

## 2013-03-22 NOTE — Progress Notes (Signed)
CRITICAL VALUE ALERT  Critical value received:  HGB 6.1  Date of notification:  03/22/13  Time of notification:  0925  Critical value read back:yes  Nurse who received alert:  K.Sham Alviar,RN   MD notified (1st page):  Dr. Adriana Simas  Time of first page:  0925  MD notified (2nd page):  Time of second page:  Responding MD:   Time MD responded: [phone]

## 2013-03-22 NOTE — Progress Notes (Signed)
Patient ID: Sharon Conner, female   DOB: April 29, 1991, 22 y.o.   MRN: [id number] Sharon Conner is a 22 y.o. [id number] at [redacted]w[redacted]d admitted for PROM Was not taking iron supplement until last couple weeks.   Subjective: Comfortable. Results for orders placed during the hospital encounter of 03/22/13 (from the past 24 hour(s))  CBC     Status: Abnormal   Collection Time    03/22/13  8:45 AM      Result Value Range   WBC 8.6  4.0 - 10.5 K/uL   RBC 3.17 (*) 3.87 - 5.11 MIL/uL   Hemoglobin 6.1 (*) 12.0 - 15.0 g/dL   HCT 14.7 (*) 82.9 - 56.2 %   MCV 66.9 (*) 78.0 - 100.0 fL   MCH 19.2 (*) 26.0 - 34.0 pg   MCHC 28.8 (*) 30.0 - 36.0 g/dL   RDW 13.0 (*) 86.5 - 78.4 %   Platelets 152  150 - 400 K/uL  RPR     Status: None   Collection Time    03/22/13  8:45 AM      Result Value Range   RPR NON REACTIVE  NON REACTIVE  TYPE AND SCREEN     Status: None   Collection Time    03/22/13  8:45 AM      Result Value Range   ABO/RH(D) O POS     Antibody Screen NEG     Sample Expiration 03/25/2013    COMPREHENSIVE METABOLIC PANEL     Status: Abnormal   Collection Time    03/22/13  8:45 AM      Result Value Range   Sodium 135  135 - 145 mEq/L   Potassium 4.0  3.5 - 5.1 mEq/L   Chloride 104  96 - 112 mEq/L   CO2 20  19 - 32 mEq/L   Glucose, Bld 78  70 - 99 mg/dL   BUN 7  6 - 23 mg/dL   Creatinine, Ser 6.96 (*) 0.50 - 1.10 mg/dL   Calcium 9.2  8.4 - 29.5 mg/dL   Total Protein 6.1  6.0 - 8.3 g/dL   Albumin 2.7 (*) 3.5 - 5.2 g/dL   AST 20  0 - 37 U/L   ALT 6  0 - 35 U/L   Alkaline Phosphatase 91  39 - 117 U/L   Total Bilirubin 0.5  0.3 - 1.2 mg/dL   GFR calc non Af Amer >90  >90 mL/min   GFR calc Af Amer >90  >90 mL/min   Objective: BP 133/82  Pulse 75  Temp(Src) 98.4 F (36.9 C) (Oral)  Resp 18  Ht 5\' 3"  (1.6 m)  Wt 85.276 kg (188 lb)  BMI 33.31 kg/m2  SpO2 100%  LMP 04/26/2012  Fetal Heart FHR: 140 bpm, variability: moderate,  accelerations:  Present,  decelerations:  Present occ  mild variable to 120 with UCs   Contractions:  irreg, mild  SVE:   Dilation: 1.5 Effacement (%): Thick Station: -1 Exam by:: W.MUhammad, CNM Cx soft, loose 1, long high Bedside US confirms cephalic  Assessment / Plan: Severe iron-deficiency anemia> type and screen sent Labor:cx unfavorable> Foley bulb placed easily Fetal Wellbeing: Category 2 Pain Control:  n/a Expected mode of delivery: NSVD  Sharon Conner 03/22/2013, 3:51 PM

## 2013-03-22 NOTE — H&P (Signed)
Attestation of Attending Supervision of Advanced Practitioner (CNM/NP): Evaluation and management procedures were performed by the Advanced Practitioner under my supervision and collaboration.  I have reviewed the Advanced Practitioner's note and chart, and I agree with the management and plan.  Jelan Batterton 03/22/2013 7:49 AM

## 2013-03-23 ENCOUNTER — Inpatient Hospital Stay (HOSPITAL_COMMUNITY): Payer: Medicaid Other | Admitting: Anesthesiology

## 2013-03-23 ENCOUNTER — Encounter (HOSPITAL_COMMUNITY): Payer: Self-pay | Admitting: Anesthesiology

## 2013-03-23 DIAGNOSIS — O9902 Anemia complicating childbirth: Secondary | ICD-10-CM

## 2013-03-23 DIAGNOSIS — O4290 Premature rupture of membranes, unspecified as to length of time between rupture and onset of labor, unspecified weeks of gestation: Secondary | ICD-10-CM

## 2013-03-23 DIAGNOSIS — D509 Iron deficiency anemia, unspecified: Secondary | ICD-10-CM

## 2013-03-23 LAB — CBC
HCT: 23 % — ABNORMAL LOW (ref 36.0–46.0)
MCH: 19.7 pg — ABNORMAL LOW (ref 26.0–34.0)
MCHC: 29.3 g/dL — ABNORMAL LOW (ref 30.0–36.0)
MCHC: 28.7 g/dL — ABNORMAL LOW (ref 30.0–36.0)
MCV: 67.4 fL — ABNORMAL LOW (ref 78.0–100.0)
Platelets: 143 10*3/uL — ABNORMAL LOW (ref 150–400)
RBC: 3.04 MIL/uL — ABNORMAL LOW (ref 3.87–5.11)
RDW: 22.3 % — ABNORMAL HIGH (ref 11.5–15.5)

## 2013-03-23 LAB — RAPID URINE DRUG SCREEN, HOSP PERFORMED: Benzodiazepines: NOT DETECTED

## 2013-03-23 MED ORDER — BENZOCAINE-MENTHOL 20-0.5 % EX AERO
1.0000 "application " | INHALATION_SPRAY | CUTANEOUS | Status: DC | PRN
  Administered 2013-03-23: 1 via TOPICAL
  Filled 2013-03-23: qty 56

## 2013-03-23 MED ORDER — FENTANYL CITRATE 0.05 MG/ML IJ SOLN
100.0000 ug | INTRAMUSCULAR | Status: DC | PRN
  Administered 2013-03-23: 100 ug via INTRAVENOUS
  Filled 2013-03-23: qty 2

## 2013-03-23 MED ORDER — EPHEDRINE 5 MG/ML INJ
10.0000 mg | INTRAVENOUS | Status: DC | PRN
  Filled 2013-03-23: qty 2

## 2013-03-23 MED ORDER — PRENATAL MULTIVITAMIN CH
1.0000 | ORAL_TABLET | Freq: Every day | ORAL | Status: DC
  Administered 2013-03-23 – 2013-03-25 (×3): 1 via ORAL
  Filled 2013-03-23 (×2): qty 1

## 2013-03-23 MED ORDER — FENTANYL 2.5 MCG/ML BUPIVACAINE 1/10 % EPIDURAL INFUSION (WH - ANES)
14.0000 mL/h | INTRAMUSCULAR | Status: DC | PRN
  Filled 2013-03-23: qty 125

## 2013-03-23 MED ORDER — WITCH HAZEL-GLYCERIN EX PADS: 1.0000 "application " | MEDICATED_PAD | CUTANEOUS | Status: DC | PRN

## 2013-03-23 MED ORDER — ONDANSETRON HCL 4 MG PO TABS: 4.0000 mg | ORAL_TABLET | ORAL | Status: DC | PRN

## 2013-03-23 MED ORDER — TETANUS-DIPHTH-ACELL PERTUSSIS 5-2.5-18.5 LF-MCG/0.5 IM SUSP
0.5000 mL | Freq: Once | INTRAMUSCULAR | Status: AC
  Administered 2013-03-24: 0.5 mL via INTRAMUSCULAR
  Filled 2013-03-23: qty 0.5

## 2013-03-23 MED ORDER — ONDANSETRON HCL 4 MG/2ML IJ SOLN: 4.0000 mg | INTRAMUSCULAR | Status: DC | PRN

## 2013-03-23 MED ORDER — DIBUCAINE 1 % RE OINT: 1.0000 "application " | TOPICAL_OINTMENT | RECTAL | Status: DC | PRN

## 2013-03-23 MED ORDER — OXYCODONE-ACETAMINOPHEN 5-325 MG PO TABS: 1.0000 | ORAL_TABLET | ORAL | Status: DC | PRN

## 2013-03-23 MED ORDER — DIPHENHYDRAMINE HCL 50 MG/ML IJ SOLN: 12.5000 mg | INTRAMUSCULAR | Status: DC | PRN

## 2013-03-23 MED ORDER — FERROUS SULFATE 325 (65 FE) MG PO TABS
325.0000 mg | ORAL_TABLET | Freq: Three times a day (TID) | ORAL | Status: DC
  Administered 2013-03-23 – 2013-03-25 (×5): 325 mg via ORAL
  Filled 2013-03-23: qty 1
  Filled 2013-03-23: qty 2
  Filled 2013-03-23 (×3): qty 1

## 2013-03-23 MED ORDER — PHENYLEPHRINE 40 MCG/ML (10ML) SYRINGE FOR IV PUSH (FOR BLOOD PRESSURE SUPPORT)
80.0000 ug | PREFILLED_SYRINGE | INTRAVENOUS | Status: DC | PRN
  Filled 2013-03-23: qty 2
  Filled 2013-03-23: qty 5

## 2013-03-23 MED ORDER — DIPHENHYDRAMINE HCL 25 MG PO CAPS: 25.0000 mg | ORAL_CAPSULE | Freq: Four times a day (QID) | ORAL | Status: DC | PRN

## 2013-03-23 MED ORDER — EPHEDRINE 5 MG/ML INJ
10.0000 mg | INTRAVENOUS | Status: DC | PRN
  Filled 2013-03-23: qty 2
  Filled 2013-03-23: qty 4

## 2013-03-23 MED ORDER — ZOLPIDEM TARTRATE 5 MG PO TABS: 5.0000 mg | ORAL_TABLET | Freq: Every evening | ORAL | Status: DC | PRN

## 2013-03-23 MED ORDER — IBUPROFEN 600 MG PO TABS
600.0000 mg | ORAL_TABLET | Freq: Four times a day (QID) | ORAL | Status: DC
  Administered 2013-03-23 – 2013-03-25 (×8): 600 mg via ORAL
  Filled 2013-03-23 (×8): qty 1

## 2013-03-23 MED ORDER — SENNOSIDES-DOCUSATE SODIUM 8.6-50 MG PO TABS
2.0000 | ORAL_TABLET | Freq: Every day | ORAL | Status: DC
  Administered 2013-03-23 – 2013-03-24 (×2): 2 via ORAL

## 2013-03-23 MED ORDER — LACTATED RINGERS IV SOLN
500.0000 mL | Freq: Once | INTRAVENOUS | Status: AC
  Administered 2013-03-23: 500 mL via INTRAVENOUS

## 2013-03-23 MED ORDER — LANOLIN HYDROUS EX OINT: TOPICAL_OINTMENT | CUTANEOUS | Status: DC | PRN

## 2013-03-23 MED ORDER — PHENYLEPHRINE 40 MCG/ML (10ML) SYRINGE FOR IV PUSH (FOR BLOOD PRESSURE SUPPORT)
80.0000 ug | PREFILLED_SYRINGE | INTRAVENOUS | Status: DC | PRN
  Filled 2013-03-23: qty 2

## 2013-03-23 MED ORDER — SIMETHICONE 80 MG PO CHEW: 80.0000 mg | CHEWABLE_TABLET | ORAL | Status: DC | PRN

## 2013-03-23 NOTE — Anesthesia Preprocedure Evaluation (Signed)

## 2013-03-23 NOTE — Progress Notes (Signed)
Sharon Conner is a 22 y.o. [phone] at [redacted]w[redacted]d   Subjective: Feels well; foley bulb out  Objective: BP 113/87  Pulse 103  Temp(Src) 98.4 F (36.9 C) (Oral)  Resp 18  Ht 5\' 3"  (1.6 m)  Wt 85.276 kg (188 lb)  BMI 33.31 kg/m2  SpO2 100%  LMP 04/26/2012      FHT:  FHR: 125 bpm, variability: moderate,  accelerations:  Present,  decelerations:  Absent UC:   rare SVE:  Exam deferred  Labs: Lab Results  Component Value Date   WBC 8.6 03/22/2013   HGB 6.1* 03/22/2013   HCT 21.2* 03/22/2013   MCV 66.9* 03/22/2013   PLT 152 03/22/2013    Assessment / Plan: IOL process PROM x 17 hours Severe iron def anemia  Begin Pitocin now that foley is out Continue Fe  Sharon Conner 03/23/2013, 12:48 AM

## 2013-03-23 NOTE — Progress Notes (Signed)
CNM notified of SVE with infant's hand felt in front of head.  Order received to continue with plan of care and start pitocin IV.

## 2013-03-23 NOTE — Progress Notes (Signed)
UR completed 

## 2013-03-24 LAB — CBC
Hemoglobin: 5.4 g/dL — CL (ref 12.0–15.0)
MCH: 19.1 pg — ABNORMAL LOW (ref 26.0–34.0)
MCV: 67.1 fL — ABNORMAL LOW (ref 78.0–100.0)
RBC: 2.83 MIL/uL — ABNORMAL LOW (ref 3.87–5.11)

## 2013-03-24 NOTE — Progress Notes (Addendum)
CRITICAL VALUE ALERT  Critical value received:  Hgb 5.4  Date of notification:  03/24/2013  Time of notification:  0628  Critical value read back:yes  Nurse who received alert:  Hart Carwin, RN  MD notified (1st page):  Debbe Odea RN took message for Dr. Jerelyn Scott who is delivering a baby will give message  Time of first page:  0635  MD notified (2nd page):  Time of second page:  Responding MD:  Dr. Jerelyn Scott  Time MD responded:  [phone] Orders received

## 2013-03-24 NOTE — Progress Notes (Signed)
Post Partum Day 1  Subjective: Feeling well this am with no complaints. Up ad lib, good PO intake, passing flatus. Denies SOB, chest pain, dizziness/lightheadedness.  Objective: Blood pressure 132/75, pulse 76, temperature 98 F (36.7 C), temperature source Oral, resp. rate 20, height 5\' 3"  (1.6 m), weight 85.276 kg (188 lb), last menstrual period 04/26/2012, SpO2 100.00%, unknown if currently breastfeeding.  Physical Exam:  General: alert, cooperative and no distress Lochia: appropriate Uterine Fundus: firm DVT Evaluation: No evidence of DVT seen on physical exam. No cords or calf tenderness. No significant calf/ankle edema.   Recent Labs  03/23/13 1002 03/24/13 0605  HGB 6.6* 5.4*  HCT 23.0* 19.0*    Assessment/Plan: PPD#1 - Doing well. Hb trended down to 5.4 but patient is asymptomatic.  Will continue supplemental Iron and monitor closely during hospitalization. Breast feeding, wants Nexplanon for contraception.  Discharge home tomorrow.    LOS: 2 days   Everlene Other 03/24/2013, 7:40 AM   Rpt hgb 6.0. P100. Asymptomatic Evaluation and management procedures were performed by Resident physician under my supervision/collaboration. Chart reviewed, patient examined by me and I agree with management and plan. Danae Orleans, CNM 03/24/2013 10:04 AM

## 2013-03-25 MED ORDER — IBUPROFEN 600 MG PO TABS: 600.0000 mg | ORAL_TABLET | Freq: Four times a day (QID) | ORAL | Status: DC

## 2013-03-25 MED ORDER — FERROUS SULFATE 325 (65 FE) MG PO TABS: 325.0000 mg | ORAL_TABLET | Freq: Three times a day (TID) | ORAL | Status: DC

## 2013-03-25 NOTE — Discharge Summary (Signed)
Obstetric Discharge Summary Reason for Admission: rupture of membranes/unfavorable cx and no labor Prenatal Procedures: ultrasound Intrapartum Procedures: spontaneous vaginal delivery Postpartum Procedures: none Complications-Operative and Postpartum: none Hemoglobin  Date Value Range Status  03/24/2013 5.4* 12.0 - 15.0 g/dL Final     DELTA CHECK NOTED     REPEATED TO VERIFY     CRITICAL RESULT CALLED TO, READ BACK BY AND VERIFIED WITH:     J STANIN 03/24/13 AT 0627 BY H SOEWARDIMAN     HCT  Date Value Range Status  03/24/2013 19.0* 36.0 - 46.0 % Final   BP 111/53  Pulse 78  Temp(Src) 98.7 F (37.1 C) (Oral)  Resp 19  Ht 5\' 3"  (1.6 m)  Wt 85.276 kg (188 lb)  BMI 33.31 kg/m2  SpO2 100%  LMP 04/26/2012  Sharon Conner was admitted for SROM with no labor. She had a foley bulb placed initially; when that came out Pitocin was started in the evening of 6/15. She progressed to SVD in the morning of 6/16. Today is PPD#2 and she is doing well. Although she was admitted in a state of anemia, that has remained stable and she has continued to deny s/s of anemia. She will go home with continued iron supplementation.  Physical Exam:  General: alert and cooperative Lochia: appropriate Uterine Fundus: firm Incision: N/A DVT Evaluation: No evidence of DVT seen on physical exam. No cords or calf tenderness. No significant calf/ankle edema.  Discharge Diagnoses: Term Pregnancy-delivered  Discharge Information: Date: 03/25/2013 Activity: pelvic rest Diet: routine Medications: PNV, Ibuprofen and Iron Condition: stable Instructions: refer to practice specific booklet Discharge to: home Follow-up Information   Follow up with [facility] [place]. Call in 6 weeks. (Pospartum Checkup and Nexplanon Insertion)    Contact information:   [address] [place] [address] [phone]     Patient up ad lib and doing well without complaints. Despite low H/H (last was 5.4/19.0) she remains  asymptomatic and desires to be discharged today. She has changed her mind about breast feeding her infant and now intends to bottle feed. She plans on using Nexplanon for contraception. She should continue Fe supplementation and is advised to be seen in by healthcare provider should s/s of anemia develop.  Newborn Data: Live born female  Birth Weight: 4 lb 15.5 oz (2255 g) APGAR: 9, 9  Home with mother.  Sharon Conner 03/25/2013, 7:43 AM  I have seen and examined this patient and I agree with the above. Sharon Conner 10:06 AM 03/25/2013
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
## 2013-11-23 ENCOUNTER — Encounter (HOSPITAL_COMMUNITY): Payer: Self-pay | Admitting: Emergency Medicine

## 2013-11-23 ENCOUNTER — Emergency Department (HOSPITAL_COMMUNITY)
Admission: EM | Admit: 2013-11-23 | Discharge: 2013-11-23 | Disposition: A | Discharge status: Home / Self Care | Payer: Medicaid Other | Attending: Emergency Medicine | Admitting: Emergency Medicine

## 2013-11-23 DIAGNOSIS — W19XXXA Unspecified fall, initial encounter: Secondary | ICD-10-CM

## 2013-11-23 DIAGNOSIS — D649 Anemia, unspecified: Secondary | ICD-10-CM | POA: Insufficient documentation

## 2013-11-23 DIAGNOSIS — S0100XA Unspecified open wound of scalp, initial encounter: Secondary | ICD-10-CM | POA: Insufficient documentation

## 2013-11-23 DIAGNOSIS — Z8619 Personal history of other infectious and parasitic diseases: Secondary | ICD-10-CM | POA: Insufficient documentation

## 2013-11-23 DIAGNOSIS — Z79899 Other long term (current) drug therapy: Secondary | ICD-10-CM | POA: Insufficient documentation

## 2013-11-23 DIAGNOSIS — Y9389 Activity, other specified: Secondary | ICD-10-CM | POA: Insufficient documentation

## 2013-11-23 DIAGNOSIS — Z87891 Personal history of nicotine dependence: Secondary | ICD-10-CM | POA: Insufficient documentation

## 2013-11-23 DIAGNOSIS — IMO0002 Reserved for concepts with insufficient information to code with codable children: Secondary | ICD-10-CM | POA: Insufficient documentation

## 2013-11-23 DIAGNOSIS — S0990XA Unspecified injury of head, initial encounter: Secondary | ICD-10-CM | POA: Insufficient documentation

## 2013-11-23 DIAGNOSIS — Z791 Long term (current) use of non-steroidal anti-inflammatories (NSAID): Secondary | ICD-10-CM | POA: Insufficient documentation

## 2013-11-23 DIAGNOSIS — Y929 Unspecified place or not applicable: Secondary | ICD-10-CM | POA: Insufficient documentation

## 2013-11-23 DIAGNOSIS — S0101XA Laceration without foreign body of scalp, initial encounter: Secondary | ICD-10-CM

## 2013-11-23 MED ORDER — ACETAMINOPHEN 325 MG PO TABS
650.0000 mg | ORAL_TABLET | Freq: Once | ORAL | Status: AC
  Administered 2013-11-23: 650 mg via ORAL
  Filled 2013-11-23: qty 2

## 2013-11-23 NOTE — ED Provider Notes (Signed)
CSN: [id number]     Arrival date & time 11/23/13  1116 History  This chart was scribed for non-physician practitioner, Emilia Beck, PA-C working with Gavin Pound. Oletta Lamas, MD by Greggory Stallion, ED scribe. This patient was seen in room TR05C/TR05C and the patient's care was started at 12:30 PM.    Chief Complaint  Patient presents with  . Fall  . Laceration   The history is provided by the patient. No language interpreter was used.   HPI Comments: Sharon Conner is a 23 y.o. female who presents to the Emergency Department complaining of a fall that occurred earlier today. Pt states she was wearing socks and slipped on some tile. Her head hit the corner of the wall but she denies LOC. She has a laceration to the right side of her head and a mild headache. The pain is aching. Her last tetanus was 2 years ago.   Past Medical History  Diagnosis Date  . Anemia   . Chlamydia infection 2013   Past Surgical History  Procedure Laterality Date  . Appendectomy     No family history on file. History  Substance Use Topics  . Smoking status: Former Smoker    Quit date: 07/09/2012  . Smokeless tobacco: Never Used  . Alcohol Use: No   OB History   Grav Para Term Preterm Abortions TAB SAB Ect Mult Living   6 2 2  0 4 3 1  0 0 2     Review of Systems  Skin: Positive for wound.  Neurological: Positive for headaches.  All other systems reviewed and are negative.   Allergies  Review of patient's allergies indicates no known allergies.  Home Medications   Current Outpatient Rx  Name  Route  Sig  Dispense  Refill  . ferrous sulfate 325 (65 FE) MG tablet   Oral   Take 1 tablet (325 mg total) by mouth 3 (three) times daily with meals.   90 tablet   3   . ibuprofen (ADVIL,MOTRIN) 600 MG tablet   Oral   Take 1 tablet (600 mg total) by mouth every 6 (six) hours.   50 tablet   1   . Prenatal Vit-Fe Fumarate-FA (PRENATAL MULTIVITAMIN) TABS   Oral   Take 1 tablet by mouth every  morning.          BP 108/86  Pulse 81  Temp(Src) 98.8 F (37.1 C) (Oral)  Resp 18  Wt 184 lb (83.462 kg)  SpO2 100%  Physical Exam  Nursing note and vitals reviewed. Constitutional: She is oriented to person, place, and time. She appears well-developed and well-nourished. No distress.  HENT:  Head: Normocephalic and atraumatic.  1 cm laceration to temporoparietal scalp with bleeding controlled.   Eyes: EOM are normal.  Neck: Neck supple. No tracheal deviation present.  Cardiovascular: Normal rate.   Pulmonary/Chest: Effort normal. No respiratory distress.  Musculoskeletal: Normal range of motion.  Neurological: She is alert and oriented to person, place, and time.  Skin: Skin is warm and dry.  Psychiatric: She has a normal mood and affect. Her behavior is normal.    ED Course  Procedures (including critical care time)  DIAGNOSTIC STUDIES: Oxygen Saturation is 100% on RA, normal by my interpretation.    COORDINATION OF CARE: 12:32 PM-Discussed treatment plan which includes stapling wound with pt at bedside and pt agreed to plan.   LACERATION REPAIR PROCEDURE NOTE The patient's identification was confirmed and consent was obtained. This procedure was performed by  Emilia BeckKaitlyn Shizuo Biskup, PA-C at 12:31 PM. Site: temporoparietal scalp Sterile procedures observed Anesthetic used (type and amt): none Suture type/size: staple Length: 1 cm # of Staples: 1 Technique: staples Complexity:simple Tetanus UTD Site anesthetized, irrigated with NS, explored without evidence of foreign body, wound well approximated, site covered with dry, sterile dressing.  Patient tolerated procedure well without complications. Instructions for care discussed verbally and patient provided with additional written instructions for homecare and f/u.  Labs Review Labs Reviewed - No data to display Imaging Review No results found.  EKG Interpretation   None       MDM   Final diagnoses:   Fall  Scalp laceration    12:37 PM Laceration repaired without difficulty. Patient does not need imaging at this time due to no LOC. Patient advised to return to the ED in 5 days for staple laceration. Patient denies any other injury.   I personally performed the services described in this documentation, which was scribed in my presence. The recorded information has been reviewed and is accurate.   Emilia BeckKaitlyn Reza Crymes, PA-C 11/23/13 1238

## 2013-11-23 NOTE — Discharge Instructions (Signed)
Keep wound clean. Take tylenol as needed for pain. Refer to attached documents for more information. Return in 5 days for suture removal.

## 2013-11-23 NOTE — ED Notes (Signed)
Pt slipped and hit right side of head on  Corner of wall.  No LOC.  No neck pain.  Abrasions to right side of face.  No blood thinners

## 2013-11-27 NOTE — ED Provider Notes (Signed)
Medical screening examination/treatment/procedure(s) were performed by non-physician practitioner and as supervising physician I was immediately available for consultation/collaboration.   Joshua Zeringue Y. Samariah Hokenson, MD 11/27/13 1501 

## 2013-11-28 ENCOUNTER — Emergency Department (HOSPITAL_COMMUNITY)
Admission: EM | Admit: 2013-11-28 | Discharge: 2013-11-28 | Disposition: A | Discharge status: Home / Self Care | Payer: Medicaid Other | Attending: Emergency Medicine | Admitting: Emergency Medicine

## 2013-11-28 ENCOUNTER — Encounter (HOSPITAL_COMMUNITY): Payer: Self-pay | Admitting: Emergency Medicine

## 2013-11-28 DIAGNOSIS — Z87891 Personal history of nicotine dependence: Secondary | ICD-10-CM | POA: Insufficient documentation

## 2013-11-28 DIAGNOSIS — Z862 Personal history of diseases of the blood and blood-forming organs and certain disorders involving the immune mechanism: Secondary | ICD-10-CM | POA: Insufficient documentation

## 2013-11-28 DIAGNOSIS — Z8619 Personal history of other infectious and parasitic diseases: Secondary | ICD-10-CM | POA: Insufficient documentation

## 2013-11-28 DIAGNOSIS — Z4802 Encounter for removal of sutures: Secondary | ICD-10-CM | POA: Insufficient documentation

## 2013-11-28 NOTE — ED Notes (Signed)
One staple present; edges approximated. Old dried blood on wound to be cleaned.

## 2013-11-28 NOTE — ED Notes (Signed)
Pt. requesting staple removal at her right scalp .

## 2013-11-28 NOTE — Discharge Instructions (Signed)
  Staple Removal Care After The staples used to close your skin have been removed. The wound needs continued care so it can heal completely and without problems. The care described here will need to be done for another 5-10 days unless your caregiver advises otherwise.  HOME CARE INSTRUCTIONS   Keep wound site dry and clean.  If skin adhesive strips were applied after the staples were removed, they will begin to peel off in a few days. If they remain after fourteen days, they may be peeled off and discarded.  If you still have a dressing, change it at least once a day or as instructed by your caregiver. If the bandage sticks, soak it off with warm water. Pat dry with a clean towel. Look for signs of infection (see below).  Reapply cream or ointment according to your caregiver's instruction. This will help prevent infection and keep the bandage from sticking. Use of a non-stick material over the wound and under the dressing or wrap will also help keep the bandage from sticking.  If the bandage becomes wet, dirty or develops a foul smell, change it as soon as possible.  New scars become sunburned easily. Use sunscreens with protection factor (SPF) of at least 15 when out in the sun.  Only take over-the-counter or prescription medicines for pain, discomfort or fever as directed by your caregiver. SEEK IMMEDIATE MEDICAL CARE IF:   There is redness, swelling or increasing pain in the wound.  Pus is coming from the wound.  An unexplained oral temperature above 102 F (38.9 C) develops.  You notice a foul smell coming from the wound or dressing.  There is a breaking open of the suture line (edges not staying together) of the wound edges after staples have been removed. Document Released: 09/06/2008 Document Revised: 12/17/2011 Document Reviewed: 09/06/2008 Exit[employer] Patient Information 2014 [employer].  

## 2013-11-28 NOTE — ED Provider Notes (Signed)
CSN: [id number]     Arrival date & time 11/28/13  2240 History  This chart was scribed for non-physician practitioner, Fuller Canadaobert Yaeli Hartung-PA, working with Hurman HornJohn M Bednar, MD by Smiley HousemanFallon Davis, ED Scribe. This patient was seen in room TR09C/TR09C and the patient's care was started at 11:37 PM.    Chief Complaint  Patient presents with  . Suture / Staple Removal    The history is provided by the patient. No language interpreter was used.   HPI Comments: Sharon Conner is a 23 y.o. female who presents to the Emergency Department for staple removal at her right parietal scalp  Pt states the original injury occurred when she hit her head on a part of a wall that sticks out. Pt denies fever, chills, nausea, and emesis. No other complaints.  Past Medical History  Diagnosis Date  . Anemia   . Chlamydia infection 2013   Past Surgical History  Procedure Laterality Date  . Appendectomy     No family history on file. History  Substance Use Topics  . Smoking status: Former Smoker    Quit date: 07/09/2012  . Smokeless tobacco: Never Used  . Alcohol Use: No   OB History   Grav Para Term Preterm Abortions TAB SAB Ect Mult Living   6 2 2  0 4 3 1  0 0 2     Review of Systems  Constitutional: Negative for fever and chills.  Respiratory: Negative for shortness of breath.   Cardiovascular: Negative for chest pain.  Gastrointestinal: Negative for nausea, vomiting, abdominal pain and diarrhea.  Neurological: Negative for headaches.  Psychiatric/Behavioral: Negative for behavioral problems and confusion.  All other systems reviewed and are negative.   Allergies  Review of patient's allergies indicates no known allergies.  Home Medications  No current outpatient prescriptions on file.  Triage Vitals: BP 119/74  Pulse 66  Temp(Src) 97.8 F (36.6 C) (Oral)  Resp 16  Ht 5\' 3"  (1.6 m)  Wt 186 lb 5 oz (84.511 kg)  BMI 33.01 kg/m2  SpO2 100%  LMP 10/14/2013  Physical Exam  Nursing note and  vitals reviewed. Constitutional: She is oriented to person, place, and time. She appears well-developed and well-nourished. No distress.  HENT:  Head: Normocephalic and atraumatic.  Eyes: EOM are normal.  Neck: Neck supple. No tracheal deviation present.  Cardiovascular: Normal rate.   Pulmonary/Chest: Effort normal. No respiratory distress.  Musculoskeletal: Normal range of motion.  Neurological: She is alert and oriented to person, place, and time.  Skin: Skin is warm and dry. No rash noted.  Well healing laceration.  No signs of infection.    Psychiatric: She has a normal mood and affect. Her behavior is normal. Judgment and thought content normal.    ED Course  Procedures (including critical care time) DIAGNOSTIC STUDIES: Oxygen Saturation is 100% on RA, normal by my interpretation.    COORDINATION OF CARE: 11:41 PM-Staple removed.  Patient informed of current plan of treatment and evaluation and agrees with plan.       MDM   Final diagnoses:  Encounter for staple removal    I personally performed the services described in this documentation, which was scribed in my presence. The recorded information has been reviewed and is accurate.    Roxy Horsemanobert Tyara Dassow, PA-C 11/29/13 0005

## 2013-11-29 NOTE — ED Provider Notes (Signed)
Medical screening examination/treatment/procedure(s) were performed by non-physician practitioner and as supervising physician I was immediately available for consultation/collaboration.   Hugh Garrow M Donis Kotowski, MD 11/29/13 0240 

## 2014-04-23 IMAGING — CT CT CERVICAL SPINE W/O CM
4 of 6 series · 14 of 33 positions shown, 16 images · non-contrast
Comparison: No priors.

CT HEAD

CLINICAL DATA: Loss of consciousness.  A syncopal event.  Injury
to the back of the head.

CT HEAD WITHOUT CONTRAST
CT CERVICAL SPINE WITHOUT CONTRAST
TECHNIQUE: Multidetector CT imaging of the head and cervical spine
was performed following the standard protocol without intravenous
contrast.  Multiplanar CT image reconstructions of the cervical
spine were also generated.

[Series 5: c-spine st · axial · 0.27mm/px · z∈[-238,-188]mm · 2 of 75 slices shown]
[im 25/75  bone]
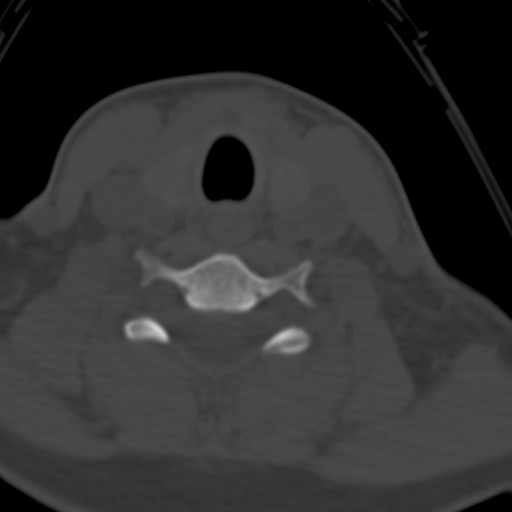
[im 50/75  bone]
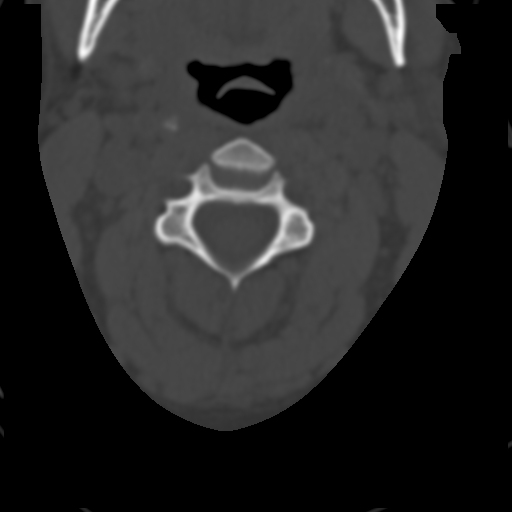

[Series 602: <mpr thick range> · axial · 0.29mm/px · z∈[-302,-201]mm · 4 of 101 slices shown, 5 images]
[im 21/101  soft-tissue]
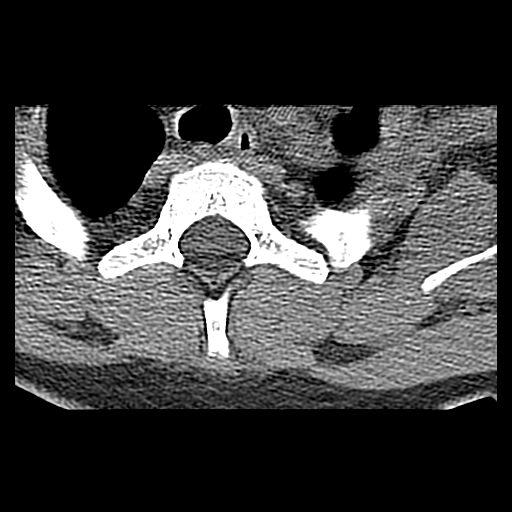
[im 21/101  bone]
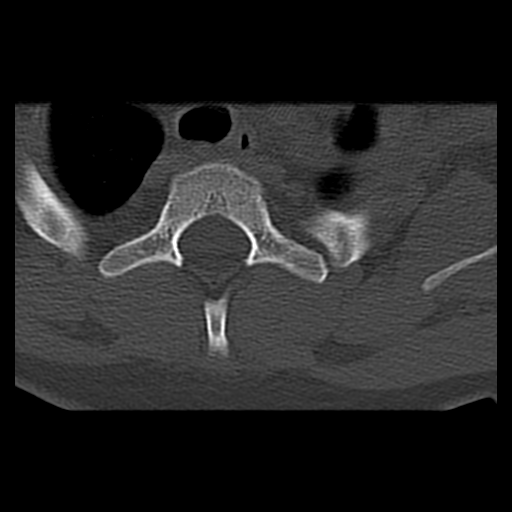
[im 41/101  bone]
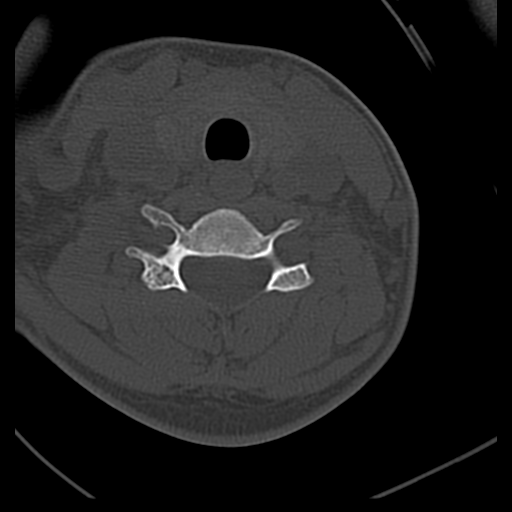
[im 61/101  bone]
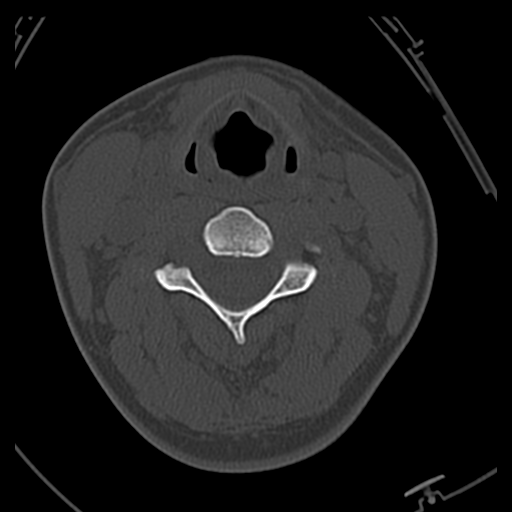
[im 81/101  bone]
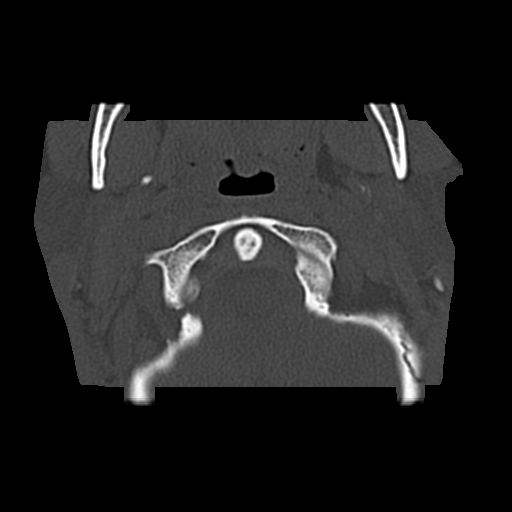

[Series 603: <mpr thick range(1)> · coronal · 0.29mm/px · 3 of 65 slices shown]
[im 13/65  bone]
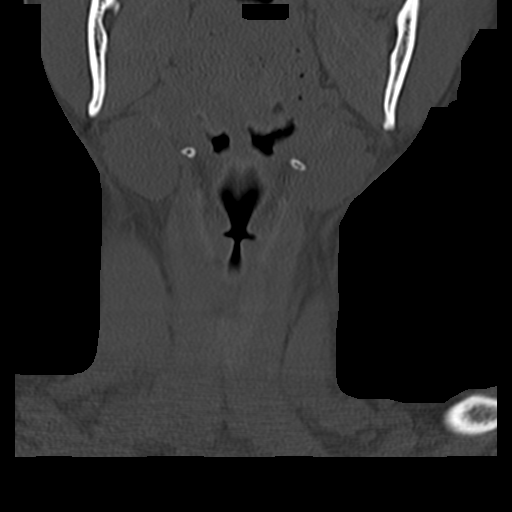
[im 26/65  bone]
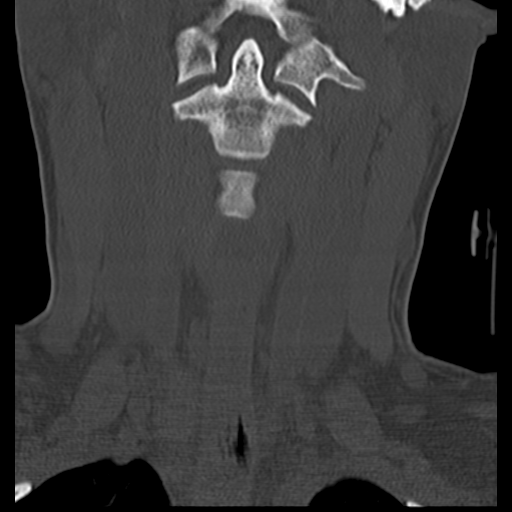
[im 39/65  bone]
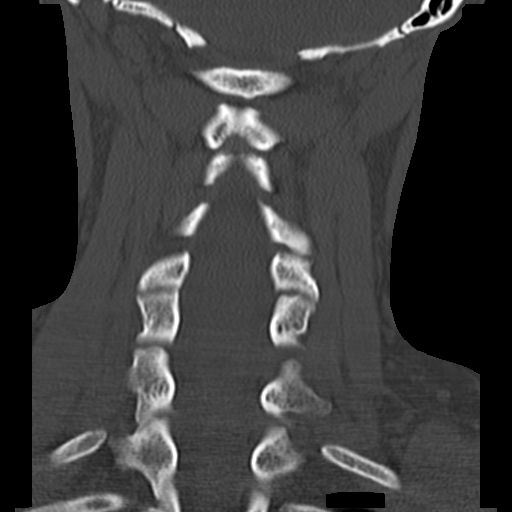

[Series 604: <mpr thick range(2)> · sagittal · 0.29mm/px · 5 of 52 slices shown, 6 images]
[im 18/52  bone]
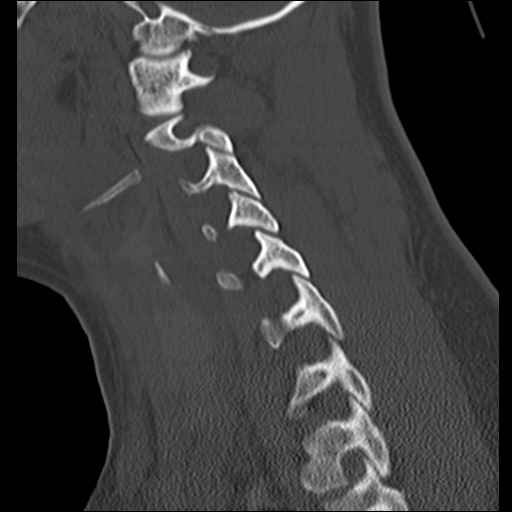
[im 22/52  bone]
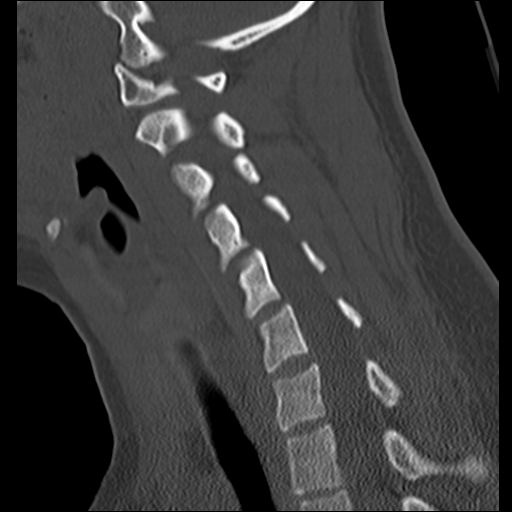
[im 26/52  soft-tissue]
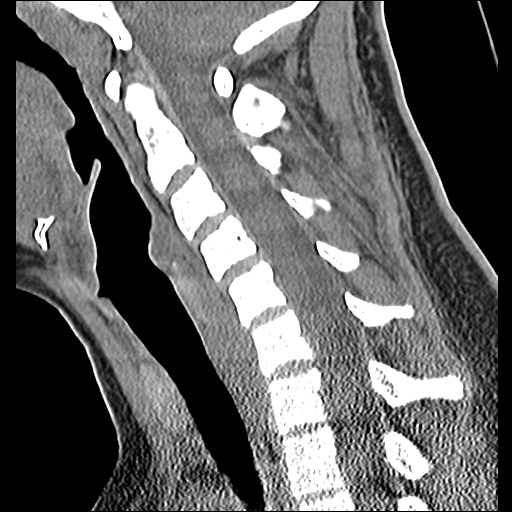
[im 26/52  bone]
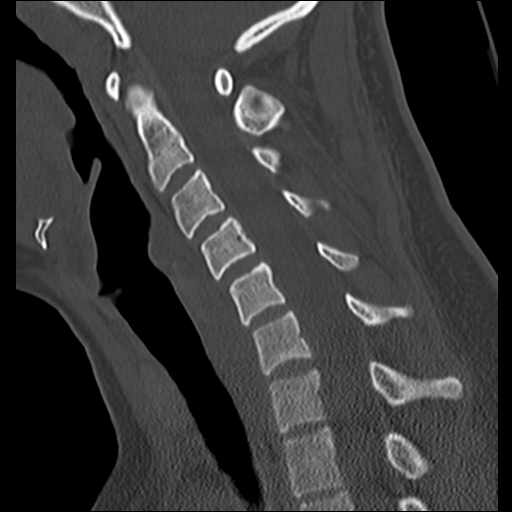
[im 30/52  bone]
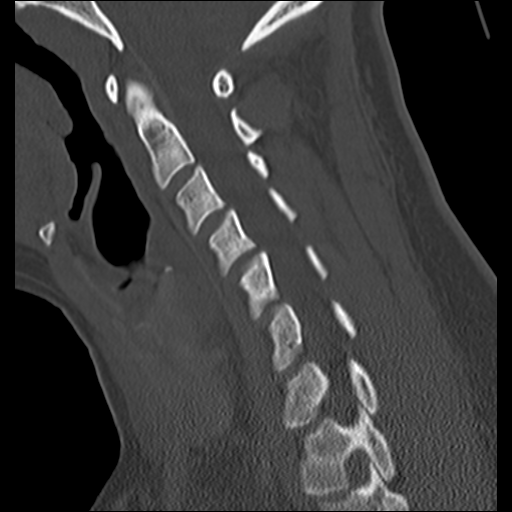
[im 35/52  bone]
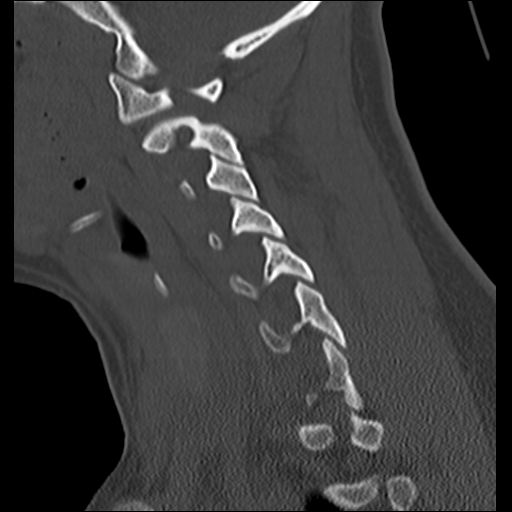

[14 of 33 positions shown; findings below may reference images not displayed]

FINDINGS: No acute displaced skull fractures are identified.  No
acute intracranial abnormality.  Specifically, no evidence of acute
post-traumatic intracranial hemorrhage, no definite regions of
acute/subacute cerebral ischemia, no focal mass, mass effect,
hydrocephalus or abnormal intra or extra-axial fluid collections.
The visualized paranasal sinuses and mastoids are well pneumatized.
IMPRESSION: 1.  No acute displaced skull fractures or acute intracranial
abnormalities.
2.  The appearance of the brain is normal.

CT CERVICAL SPINE
FINDINGS: No acute displaced fractures of the cervical spine.
There is reversal of the normal cervical lordosis which is
presumably positional.  Alignment is otherwise anatomic.
Prevertebral soft tissues are normal.  Visualized portions of the
upper thorax are unremarkable.
IMPRESSION: 1.  No evidence of significant acute traumatic injury to the
cervical spine.

## 2014-08-09 ENCOUNTER — Encounter (HOSPITAL_COMMUNITY): Payer: Self-pay | Admitting: Emergency Medicine

## 2015-04-12 ENCOUNTER — Encounter (HOSPITAL_COMMUNITY): Payer: Self-pay | Admitting: *Deleted

## 2015-04-14 ENCOUNTER — Encounter (HOSPITAL_COMMUNITY): Payer: Self-pay

## 2015-04-14 ENCOUNTER — Ambulatory Visit (HOSPITAL_COMMUNITY)
Admission: RE | Admit: 2015-04-14 | Discharge: 2015-04-14 | Disposition: A | Discharge status: Home / Self Care | Payer: Medicaid Other | Source: Ambulatory Visit | Attending: Obstetrics and Gynecology | Admitting: Obstetrics and Gynecology

## 2015-04-14 VITALS — BP 104/62 | Temp 98.1°F | Ht 63.0 in | Wt 188.0 lb

## 2015-04-14 DIAGNOSIS — R87610 Atypical squamous cells of undetermined significance on cytologic smear of cervix (ASC-US): Secondary | ICD-10-CM

## 2015-04-14 DIAGNOSIS — Z1239 Encounter for other screening for malignant neoplasm of breast: Secondary | ICD-10-CM

## 2015-04-14 NOTE — Progress Notes (Signed)
CLINIC:  [facility]  REASON FOR VISIT: Well-woman exam   HISTORY OF PRESENT ILLNESS:  Sharon Conner is a 24 y.o. female [facility] exam. No family history of breast cancer. She has no complaints today.  She had an abnormal pap in 10/2012 that [facility], but there was no treatment recommended for this abnormal result. Her [facility]  showed ASC-US, HPV (-). She is scheduled for colposcopy on 05/05/15.  REVIEW OF SYSTEMS:  Denies any breast pain, nodularity, nipple inversion, or nipple discharge bilaterally.   ALLERGIES: No Known Allergies  CURRENT MEDICATIONS:  No current outpatient prescriptions on file prior to encounter.   No current facility-administered medications on file prior to encounter.     PHYSICAL EXAM:  Vitals:  Filed Vitals:   04/14/15 0812  BP: 104/62  Temp: 98.1 F (36.7 C)   General: Well-nourished, well-appearing female in no acute distress.  She is unaccompanied in clinic today.  Stoney BangSabrina Holland, LPN was present during physical exam for this patient.  Breasts: Bilateral breasts exposed and observed with patient standing (arms at side, arms on hips, arms on hips flexed forward, and arms over head).  No gross abnormalities including breast skin puckering or dimpling noted on observation.  Breasts symmetrical without evidence of skin redness, thickening, or peau d'orange appearance. No nipple retraction or nipple discharge noted bilaterally.  No breast nodularity palpated in bilateral breasts.  Normal fibrocystic breast changes noted in bilateral breasts. Axillary lymph nodes: No axillary lymphadenopathy bilaterally.   GU: Exam deferred. Pap smear is up-to-date.  Colposcopy pending.  ASSESSMENT & PLAN:   1. Breast cancer screening: Sharon Conner has no palpable breast abnormalities on her clinical breast exam today.   She was given instructions and educational materials regarding breast self-awareness. Sharon Conner is aware of this plan and agrees with it.   2. Cervical cancer screening: Sharon Conner will have her colposcopy as scheduled on [facility]. She was provided education about what to expect and common side effects of colposcopy.  She will receive results of the [facility]copy by [facility] and was encouraged to contact them if she had not received notification of results within 2 weeks after the procedure is completed.  She verbalized understanding.    Sharon Conner was encouraged to ask questions and all questions were answered to her satisfaction.    Lubertha BasqueGretchen Alekhya Gravlin, NP [facility]  [phone]

## 2015-05-05 ENCOUNTER — Encounter: Payer: Medicaid Other | Admitting: Obstetrics & Gynecology

## 2015-06-16 ENCOUNTER — Inpatient Hospital Stay (HOSPITAL_COMMUNITY): Payer: Self-pay

## 2015-06-16 ENCOUNTER — Encounter (HOSPITAL_COMMUNITY): Payer: Self-pay | Admitting: *Deleted

## 2015-06-16 ENCOUNTER — Inpatient Hospital Stay (HOSPITAL_COMMUNITY)
Admission: AD | Admit: 2015-06-16 | Discharge: 2015-06-16 | Disposition: A | Discharge status: Home / Self Care | Payer: Self-pay | Source: Ambulatory Visit | Attending: Family Medicine | Admitting: Family Medicine

## 2015-06-16 DIAGNOSIS — D649 Anemia, unspecified: Secondary | ICD-10-CM | POA: Insufficient documentation

## 2015-06-16 DIAGNOSIS — O99011 Anemia complicating pregnancy, first trimester: Secondary | ICD-10-CM

## 2015-06-16 DIAGNOSIS — O368910 Maternal care for other specified fetal problems, first trimester, not applicable or unspecified: Secondary | ICD-10-CM | POA: Insufficient documentation

## 2015-06-16 DIAGNOSIS — Z3A01 Less than 8 weeks gestation of pregnancy: Secondary | ICD-10-CM | POA: Insufficient documentation

## 2015-06-16 DIAGNOSIS — O468X1 Other antepartum hemorrhage, first trimester: Secondary | ICD-10-CM

## 2015-06-16 DIAGNOSIS — Z87891 Personal history of nicotine dependence: Secondary | ICD-10-CM | POA: Insufficient documentation

## 2015-06-16 DIAGNOSIS — O9989 Other specified diseases and conditions complicating pregnancy, childbirth and the puerperium: Secondary | ICD-10-CM

## 2015-06-16 DIAGNOSIS — O418X10 Other specified disorders of amniotic fluid and membranes, first trimester, not applicable or unspecified: Secondary | ICD-10-CM

## 2015-06-16 DIAGNOSIS — R109 Unspecified abdominal pain: Secondary | ICD-10-CM

## 2015-06-16 DIAGNOSIS — O26899 Other specified pregnancy related conditions, unspecified trimester: Secondary | ICD-10-CM

## 2015-06-16 LAB — CBC
HEMATOCRIT: 25.1 % — AB (ref 36.0–46.0)
Hemoglobin: 7.4 g/dL — ABNORMAL LOW (ref 12.0–15.0)
MCH: 18.9 pg — ABNORMAL LOW (ref 26.0–34.0)
MCHC: 29.5 g/dL — ABNORMAL LOW (ref 30.0–36.0)
MCV: 64 fL — ABNORMAL LOW (ref 78.0–100.0)
PLATELETS: 218 10*3/uL (ref 150–400)
RBC: 3.92 MIL/uL (ref 3.87–5.11)
RDW: 22.5 % — AB (ref 11.5–15.5)
WBC: 9.8 10*3/uL (ref 4.0–10.5)

## 2015-06-16 LAB — URINALYSIS, ROUTINE W REFLEX MICROSCOPIC
BILIRUBIN URINE: NEGATIVE
Glucose, UA: NEGATIVE mg/dL
Hgb urine dipstick: NEGATIVE
Ketones, ur: NEGATIVE mg/dL
LEUKOCYTES UA: NEGATIVE
NITRITE: NEGATIVE
Protein, ur: NEGATIVE mg/dL
UROBILINOGEN UA: 0.2 mg/dL (ref 0.0–1.0)
pH: 5.5 (ref 5.0–8.0)

## 2015-06-16 LAB — WET PREP, GENITAL
Clue Cells Wet Prep HPF POC: NONE SEEN
Trich, Wet Prep: NONE SEEN
YEAST WET PREP: NONE SEEN

## 2015-06-16 LAB — ABO/RH: ABO/RH(D): O POS

## 2015-06-16 LAB — POCT PREGNANCY, URINE: PREG TEST UR: POSITIVE — AB

## 2015-06-16 LAB — HCG, QUANTITATIVE, PREGNANCY: HCG, BETA CHAIN, QUANT, S: 62598 m[IU]/mL — AB (ref <5)

## 2015-06-16 MED ORDER — FERROUS SULFATE 325 (65 FE) MG PO TABS: 325.0000 mg | ORAL_TABLET | Freq: Three times a day (TID) | ORAL | Status: AC

## 2015-06-16 MED ORDER — METOCLOPRAMIDE HCL 10 MG PO TABS
10.0000 mg | ORAL_TABLET | Freq: Once | ORAL | Status: AC
  Administered 2015-06-16: 10 mg via ORAL
  Filled 2015-06-16: qty 1

## 2015-06-16 NOTE — MAU Provider Note (Signed)
History     CSN: [id number]  Arrival date and time: 06/16/15 1337   First Provider Initiated Contact with Patient 06/16/15 1452      Chief Complaint  Patient presents with  . Abdominal Pain  . Possible Pregnancy  . Emesis   HPI  Sharon Conner is a 24 y.o. [phone] female who presents for abdominal pain, nausea, and possible pregnancy.  Has not taken pregnancy test at home. No birth control.  Abdominal cramping x 2 weeks; comes & goes. Pain worsened since yesterday. Rates as 7/10. Took tylenol with mild relief.  Denies vaginal bleeding, vaginal discharge, or dysuria.  Denies fever.  N/v since yesterday. Vomited 5 times yesterday. Nauseated today.      OB History    Gravida Para Term Preterm AB TAB SAB Ectopic Multiple Living   0 0 0 2      Past Medical History  Diagnosis Date  . Anemia   . Chlamydia infection 2013    Past Surgical History  Procedure Laterality Date  . Appendectomy      History reviewed. No pertinent family history.  Social History  Substance Use Topics  . Smoking status: Former Smoker    Quit date: 07/09/2012  . Smokeless tobacco: Never Used  . Alcohol Use: No    Allergies: No Known Allergies  No prescriptions prior to admission    Review of Systems  Constitutional: Negative.   HENT: Negative.   Respiratory: Negative.  Negative for shortness of breath.   Cardiovascular: Negative.  Negative for chest pain and palpitations.  Gastrointestinal: Positive for nausea, vomiting and abdominal pain. Negative for diarrhea and constipation.  Genitourinary: Negative for dysuria.       Denies vaginal bleeding or discharge  Neurological: Negative for dizziness and headaches.   Physical Exam   Blood pressure 131/78, pulse 65, temperature 98.4 F (36.9 C), temperature source Oral, resp. rate 18, height  (1.6 m), weight 85.276 kg (188 lb), last menstrual period 04/29/2015, unknown if currently breastfeeding.  Physical Exam   Nursing note and vitals reviewed. Constitutional: She is oriented to person, place, and time. She appears well-developed and well-nourished. No distress.  HENT:  Head: Normocephalic and atraumatic.  Eyes: Conjunctivae are normal. Right eye exhibits no discharge. Left eye exhibits no discharge. No scleral icterus.  Neck: Normal range of motion.  Cardiovascular: Normal rate, regular rhythm and normal heart sounds.   No murmur heard. Respiratory: Effort normal and breath sounds normal. No respiratory distress. She has no wheezes.  GI: Soft. Bowel sounds are normal. She exhibits no distension. There is no tenderness. There is no rebound.  Neurological: She is alert and oriented to person, place, and time.  Skin: Skin is warm and dry. She is not diaphoretic.  Psychiatric: She has a normal mood and affect. Her behavior is normal. Judgment and thought content normal.    MAU Course  Procedures Results for orders placed or performed during the hospital encounter of 06/16/15 (from the past 24 hour(s))  Urinalysis, Routine w reflex microscopic (not at [facility])     Status: Abnormal   Collection Time: 06/16/15  2:05 PM  Result Value Ref Range   Color, Urine YELLOW YELLOW   APPearance CLEAR CLEAR   Specific Gravity, Urine >1.030 (H) 1.005 - 1.030   pH 5.5 5.0 - 8.0   Glucose, UA NEGATIVE NEGATIVE mg/dL   Hgb urine dipstick NEGATIVE NEGATIVE   Bilirubin Urine NEGATIVE NEGATIVE   Ketones,  ur NEGATIVE NEGATIVE mg/dL   Protein, ur NEGATIVE NEGATIVE mg/dL   Urobilinogen, UA 0.2 0.0 - 1.0 mg/dL   Nitrite NEGATIVE NEGATIVE   Leukocytes, UA NEGATIVE NEGATIVE  Pregnancy, urine POC     Status: Abnormal   Collection Time: 06/16/15  2:18 PM  Result Value Ref Range   Preg Test, Ur POSITIVE (A) NEGATIVE  CBC     Status: Abnormal   Collection Time: 06/16/15  3:00 PM  Result Value Ref Range   WBC 9.8 4.0 - 10.5 K/uL   RBC 3.92 3.87 - 5.11 MIL/uL   Hemoglobin 7.4 (L) 12.0 - 15.0 g/dL   HCT 16.1 (L) 09.6 -  46.0 %   MCV 64.0 (L) 78.0 - 100.0 fL   MCH 18.9 (L) 26.0 - 34.0 pg   MCHC 29.5 (L) 30.0 - 36.0 g/dL   RDW 04.5 (H) 40.9 - 81.1 %   Platelets 218 150 - 400 K/uL  ABO/Rh     Status: None   Collection Time: 06/16/15  3:20 PM  Result Value Ref Range   ABO/RH(D) O POS   Wet prep, genital     Status: Abnormal   Collection Time: 06/16/15  3:40 PM  Result Value Ref Range   Yeast Wet Prep HPF POC NONE SEEN NONE SEEN   Trich, Wet Prep NONE SEEN NONE SEEN   Clue Cells Wet Prep HPF POC NONE SEEN NONE SEEN   WBC, Wet Prep HPF POC FEW (A) NONE SEEN   US Ob Comp Less 14 Wks  06/16/2015   CLINICAL DATA:  Abdominal pain, nausea and vomiting for 2 days. Positive pregnancy test. Patient is 6 weeks and 6 days pregnant according to last menstrual period.  EXAM: OBSTETRIC <14 WK [place] AND TRANSVAGINAL OB US  TECHNIQUE: Both transabdominal and transvaginal ultrasound examinations were performed for complete evaluation of the gestation as well as the maternal uterus, adnexal regions, and pelvic cul-de-sac. Transvaginal technique was performed to assess early pregnancy.  COMPARISON:  Non for this pregnancy.  FINDINGS: Intrauterine gestational sac: Visualized  Yolk sac:  Visualized  Embryo:  Visualized  Cardiac Activity: Visualized  Heart Rate: 115  bpm  CRL: 6.3 mm corresponding to 6 w 3 d [place] EDC: 02/06/2016  There is a small subchorionic hemorrhage.  Maternal uterus/adnexae: Right ovary measures 3.9 x 2.3 x 2.4 cm and contains a 2.2 cm simple in appearance cyst. Left ovary measures 3.1 x 2.0 x 2.1 cm.  IMPRESSION: Single live intrauterine pregnancy which corresponds to 6 weeks and 3 days gestational.  Small subchorionic hemorrhage.  Normal appearance of the maternal ovaries.   Electronically Signed   By: Ted Mcalpine M.D.   On: 06/16/2015 16:41   US Ob Transvaginal  06/16/2015   CLINICAL DATA:  Abdominal pain, nausea and vomiting for 2 days. Positive pregnancy test. Patient is 6 weeks and 6 days pregnant according  to last menstrual period.  EXAM: OBSTETRIC <14 WK [place] AND TRANSVAGINAL OB US  TECHNIQUE: Both transabdominal and transvaginal ultrasound examinations were performed for complete evaluation of the gestation as well as the maternal uterus, adnexal regions, and pelvic cul-de-sac. Transvaginal technique was performed to assess early pregnancy.  COMPARISON:  Non for this pregnancy.  FINDINGS: Intrauterine gestational sac: Visualized  Yolk sac:  Visualized  Embryo:  Visualized  Cardiac Activity: Visualized  Heart Rate: 115  bpm  CRL: 6.3 mm corresponding to 6 w 3 d [place] EDC: 02/06/2016  There is a small subchorionic hemorrhage.  Maternal uterus/adnexae:  Right ovary measures 3.9 x 2.3 x 2.4 cm and contains a 2.2 cm simple in appearance cyst. Left ovary measures 3.1 x 2.0 x 2.1 cm.  IMPRESSION: Single live intrauterine pregnancy which corresponds to 6 weeks and 3 days gestational.  Small subchorionic hemorrhage.  Normal appearance of the maternal ovaries.   Electronically Signed   By: Ted Mcalpine M.D.   On: 06/16/2015 16:41     MDM Positive pregnancy test O pos Reglan given- nausea improved Ultrasound - [facility] @ [redacted]w[redacted]d, small subchorionic hemorrhage Patient has history of anemia; stopped taking iron supplement. Asymptomatic. Will rx iron & give instruction for iron rich diet.   Assessment and Plan  A: 1. Abdominal pain in pregnancy   2. Anemia affecting pregnancy in first trimester   3. Subchorionic hematoma in first trimester    P: Discharge home Pregnancy verification letter given List of providers given - start prenatal care Start prenatal vitamins Rx iron supplement Discussed reasons to return to MAU  Judeth Horn, NP  06/16/2015, 2:52 PM

## 2015-06-16 NOTE — Discharge Instructions (Signed)
Subchorionic Hematoma A subchorionic hematoma is a gathering of blood between the outer wall of the placenta and the inner wall of the womb (uterus). The placenta is the organ that connects the fetus to the wall of the uterus. The placenta performs the feeding, breathing (oxygen to the fetus), and waste removal (excretory work) of the fetus.  Subchorionic hematoma is the most common abnormality found on a result from ultrasonography done during the first trimester or early second trimester of pregnancy. If there has been little or no vaginal bleeding, early small hematomas usually shrink on their own and do not affect your baby or pregnancy. The blood is gradually absorbed over 1-2 weeks. When bleeding starts later in pregnancy or the hematoma is larger or occurs in an older pregnant woman, the outcome may not be as good. Larger hematomas may get bigger, which increases the chances for miscarriage. Subchorionic hematoma also increases the risk of premature detachment of the placenta from the uterus, preterm (premature) labor, and stillbirth. HOME CARE INSTRUCTIONS  Stay on bed rest if your health care provider recommends this. Although bed rest will not prevent more bleeding or prevent a miscarriage, your health care provider may recommend bed rest until you are advised otherwise.  Avoid heavy lifting (more than 10 lb [4.5 kg]), exercise, sexual intercourse, or douching as directed by your health care provider.  Keep track of the number of pads you use each day and how soaked (saturated) they are. Write down this information.  Do not use tampons.  Keep all follow-up appointments as directed by your health care provider. Your health care provider may ask you to have follow-up blood tests or ultrasound tests or both. SEEK IMMEDIATE MEDICAL CARE IF:  You have severe cramps in your stomach, back, abdomen, or pelvis.  You have a fever.  You pass large clots or tissue. Save any tissue for your health  care provider to look at.  Your bleeding increases or you become lightheaded, feel weak, or have fainting episodes. Document Released: 01/09/2007 Document Revised: 02/08/2014 Document Reviewed: 04/23/2013 [facility] Patient Information 2015 [place], [place]. This information is not intended to replace advice given to you by your health care provider. Make sure you discuss any questions you have with your health care provider.   Pregnancy and Anemia Anemia is a condition in which the concentration of red blood cells or hemoglobin in the blood is below normal. Hemoglobin is a substance in red blood cells that carries oxygen to the tissues of the body. Anemia results in not enough oxygen reaching these tissues.  Anemia during pregnancy is common because the fetus uses more iron and folic acid as it is developing. Your body may not produce enough red blood cells because of this. Also, during pregnancy, the liquid part of the blood (plasma) increases by about 50%, and the red blood cells increase by only 25%. This lowers the concentration of the red blood cells and creates a natural anemia-like situation.  CAUSES  The most common cause of anemia during pregnancy is not having enough iron in the body to make red blood cells (iron deficiency anemia). Other causes may include:  Folic acid deficiency.  Vitamin B12 deficiency.  Certain prescription or over-the-counter medicines.  Certain medical conditions or infections that destroy red blood cells.  A low platelet count and bleeding caused by antibodies that go through the placenta to the fetus from the mother's blood. SIGNS AND SYMPTOMS  Mild anemia may not be noticeable. If it becomes  severe, symptoms may include:  Tiredness.  Shortness of breath, especially with exercise.  Weakness.  Fainting.  Pale looking skin.  Headaches.  Feeling a fast or irregular heartbeat (palpitations). DIAGNOSIS  The type of anemia is usually diagnosed from  your family and medical history and blood tests. TREATMENT  Treatment of anemia during pregnancy depends on the cause of the anemia. Treatment can include:  Supplements of iron, vitamin B12, or folic acid.  A blood transfusion. This may be needed if blood loss is severe.  Hospitalization. This may be needed if there is significant continual blood loss.  Dietary changes. HOME CARE INSTRUCTIONS   Follow your dietitian's or health care provider's dietary recommendations.  Increase your vitamin C intake. This will help the stomach absorb more iron.  Eat a diet rich in iron. This would include foods such as:  Liver.  Beef.  Whole grain bread.  Eggs.  Dried fruit.  Take iron and vitamins as directed by your health care provider.  Eat green leafy vegetables. These are a good source of folic acid. SEEK MEDICAL CARE IF:   You have frequent or lasting headaches.  You are looking pale.  You are bruising easily. SEEK IMMEDIATE MEDICAL CARE IF:   You have extreme weakness, shortness of breath, or chest pain.  You become dizzy or have trouble concentrating.  You have heavy vaginal bleeding.  You develop a rash.  You have bloody or black, tarry stools.  You faint.  You vomit up blood.  You vomit repeatedly.  You have abdominal pain.  You have a fever or persistent symptoms for more than 2-3 days.  You have a fever and your symptoms suddenly get worse.  You are dehydrated. MAKE SURE YOU:   Understand these instructions.  Will watch your condition.  Will get help right away if you are not doing well or get worse. Document Released: 09/21/2000 Document Revised: 07/15/2013 Document Reviewed: 05/06/2013 [facility] Patient Information 2015 [place], [place]. This information is not intended to replace advice given to you by your health care provider. Make sure you discuss any questions you have with your health care provider.

## 2015-06-16 NOTE — MAU Note (Signed)
Pt stated she has been havign abd x2 weeks that has gotten worse. C/O n/v x 2 days. Missed period LMP 04/29/15

## 2015-06-17 LAB — HIV ANTIBODY (ROUTINE TESTING W REFLEX): HIV SCREEN 4TH GENERATION: NONREACTIVE

## 2015-06-17 LAB — GC/CHLAMYDIA PROBE AMP (~~LOC~~) NOT AT ARMC
CHLAMYDIA, DNA PROBE: NEGATIVE
Neisseria Gonorrhea: NEGATIVE

## 2016-02-20 ENCOUNTER — Emergency Department
Admission: EM | Admit: 2016-02-20 | Discharge: 2016-02-20 | Disposition: A | Discharge status: Home / Self Care | Payer: Self-pay | Attending: Emergency Medicine | Admitting: Emergency Medicine

## 2016-02-20 DIAGNOSIS — Y999 Unspecified external cause status: Secondary | ICD-10-CM | POA: Insufficient documentation

## 2016-02-20 DIAGNOSIS — Y929 Unspecified place or not applicable: Secondary | ICD-10-CM | POA: Insufficient documentation

## 2016-02-20 DIAGNOSIS — Y9389 Activity, other specified: Secondary | ICD-10-CM | POA: Insufficient documentation

## 2016-02-20 DIAGNOSIS — Z79899 Other long term (current) drug therapy: Secondary | ICD-10-CM | POA: Insufficient documentation

## 2016-02-20 DIAGNOSIS — S0181XA Laceration without foreign body of other part of head, initial encounter: Secondary | ICD-10-CM | POA: Insufficient documentation

## 2016-02-20 DIAGNOSIS — Z87891 Personal history of nicotine dependence: Secondary | ICD-10-CM | POA: Insufficient documentation

## 2016-02-20 MED ORDER — LIDOCAINE HCL (PF) 1 % IJ SOLN
5.0000 mL | Freq: Once | INTRAMUSCULAR | Status: DC
  Filled 2016-02-20: qty 5

## 2016-02-20 NOTE — Discharge Instructions (Signed)
Facial Laceration A facial laceration is a cut on the face. These injuries can be painful and cause bleeding. Some cuts may need to be closed with stitches (sutures), skin adhesive strips, or wound glue. Cuts usually heal quickly but can leave a scar. It can take 1-2 years for the scar to go away completely. HOME CARE   Only take medicines as told by your doctor.  Follow your doctor's instructions for wound care. For Stitches:  Keep the cut clean and dry.  If you have a bandage (dressing), change it at least once a day. Change the bandage if it gets wet or dirty, or as told by your doctor.  Wash the cut with soap and water 2 times a day. Rinse the cut with water. Pat it dry with a clean towel.  Put a thin layer of medicated cream on the cut as told by your doctor.  You may shower after the first 24 hours. Do not soak the cut in water until the stitches are removed.  Have your stitches removed as told by your doctor.  Do not wear any makeup until a few days after your stitches are removed. For Skin Adhesive Strips:  Keep the cut clean and dry.  Do not get the strips wet. You may take a bath, but be careful to keep the cut dry.  If the cut gets wet, pat it dry with a clean towel.  The strips will fall off on their own. Do not remove the strips that are still stuck to the cut. For Wound Glue:  You may shower or take baths. Do not soak or scrub the cut. Do not swim. Avoid heavy sweating until the glue falls off on its own. After a shower or bath, pat the cut dry with a clean towel.  Do not put medicine or makeup on your cut until the glue falls off.  If you have a bandage, do not put tape over the glue.  Avoid lots of sunlight or tanning lamps until the glue falls off.  The glue will fall off on its own in 5-10 days. Do not pick at the glue. After Healing:  Put sunscreen on the cut for the first year to reduce your scar. GET HELP IF:  You have a fever. GET HELP RIGHT AWAY  IF:   Your cut area gets red, painful, or puffy (swollen).  You see a yellowish-white fluid (pus) coming from the cut.   This information is not intended to replace advice given to you by your health care provider. Make sure you discuss any questions you have with your health care provider.   Document Released: 03/12/2008 Document Revised: 10/15/2014 Document Reviewed: 05/07/2013 Elsevier Interactive Patient Education 2016 [place] Please return in 5 days to have your stitches/staples removed or sooner if you have concerns.  Keep area clean and dry for 24 hours. Do not remove bandage, if applied.  After 24 hours, remove bandage and wash wound gently with mild soap and warm water. Reapply a new bandage after cleaning wound, if directed.  Continue daily cleansing with soap and water until stitches/staples are removed.  Do not apply any ointments or creams to the wound while stitches/staples are in place, as this may cause delayed healing.  Notify the office if you experience any of the following signs of infection: Swelling, redness, pus drainage, streaking, fever >101.0 F  Notify the office if you experience excessive bleeding that does not stop after 15-20  minutes of constant, firm pressure.  Tylenol if needed for headache. Keep area clean and dry. Read information about head injuries. Head Injury, Adult You have a head injury. Headaches and throwing up (vomiting) are common after a head injury. It should be easy to wake up from sleeping. Sometimes you must stay in the hospital. Most problems happen within the first 24 hours. Side effects may occur up to 7-10 days after the injury.  WHAT ARE THE TYPES OF HEAD INJURIES? Head injuries can be as minor as a bump. Some head injuries can be more severe. More severe head injuries include:  A jarring injury to the brain (concussion).  A bruise of the brain (contusion). This mean there is bleeding in the brain  that can cause swelling.  A cracked skull (skull fracture).  Bleeding in the brain that collects, clots, and forms a bump (hematoma). WHEN SHOULD I GET HELP RIGHT AWAY?   You are confused or sleepy.  You cannot be woken up.  You feel sick to your stomach (nauseous) or keep throwing up (vomiting).  Your dizziness or unsteadiness is getting worse.  You have very bad, lasting headaches that are not helped by medicine. Take medicines only as told by your doctor.  You cannot use your arms or legs like normal.  You cannot walk.  You notice changes in the black spots in the center of the colored part of your eye (pupil).  You have clear or bloody fluid coming from your nose or ears.  You have trouble seeing. During the next 24 hours after the injury, you must stay with someone who can watch you. This person should get help right away (call 911 in the [place] if you start to shake and are not able to control it (have seizures), you pass out, or you are unable to wake up. HOW CAN I PREVENT A HEAD INJURY IN THE FUTURE?  Wear seat belts.  Wear a helmet while bike riding and playing sports like football.  Stay away from dangerous activities around the house. WHEN CAN I RETURN TO NORMAL ACTIVITIES AND ATHLETICS? See your doctor before doing these activities. You should not do normal activities or play contact sports until 1 week after the following symptoms have stopped:  Headache that does not go away.  Dizziness.  Poor attention.  Confusion.  Memory problems.  Sickness to your stomach or throwing up.  Tiredness.  Fussiness.  Bothered by bright lights or loud noises.  Anxiousness or depression.  Restless sleep. MAKE SURE YOU:   Understand these instructions.  Will watch your condition.  Will get help right away if you are not doing well or get worse.   This information is not intended to replace advice given to you by your health care provider. Make sure you  discuss any questions you have with your health care provider.   Document Released: 09/06/2008 Document Revised: 10/15/2014 Document Reviewed: 06/01/2013 Elsevier Interactive Patient Education [employer]2016 [employer]
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
## 2016-04-20 ENCOUNTER — Encounter (HOSPITAL_COMMUNITY): Payer: Self-pay | Admitting: *Deleted

## 2016-11-07 IMAGING — US US OB COMP LESS 14 WK
1 series · 15 of 28 positions shown · non-contrast
Comparison: Non for this pregnancy.

CLINICAL DATA: Abdominal pain, nausea and vomiting for 2 days.
Positive pregnancy test. Patient is 6 weeks and 6 days pregnant
according to last menstrual period.

EXAM:
OBSTETRIC <14 WK US AND TRANSVAGINAL OB US
TECHNIQUE: Both transabdominal and transvaginal ultrasound examinations were
performed for complete evaluation of the gestation as well as the
maternal uterus, adnexal regions, and pelvic cul-de-sac.
Transvaginal technique was performed to assess early pregnancy.

[Series 1: us ob comp less 14 wk · 92 acquisitions, 15 frames shown]
[im 1/92]
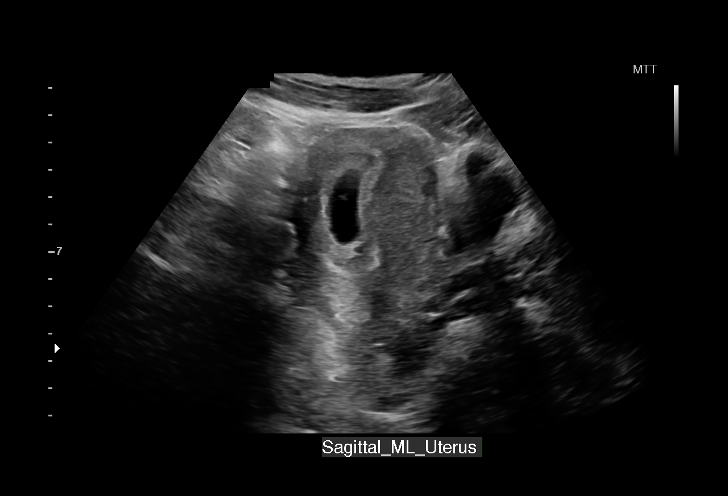
[im 7/92]
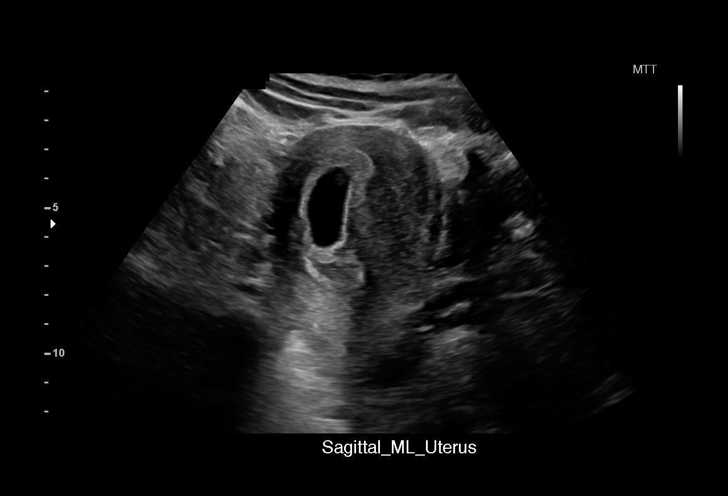
[im 14/92]
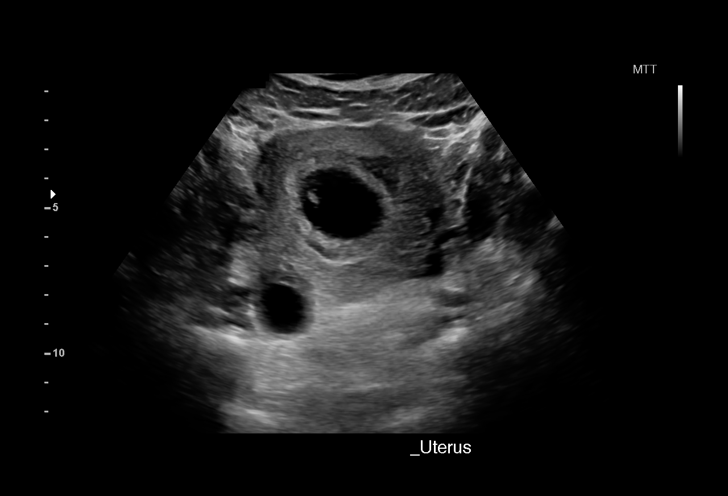
[im 21/92]
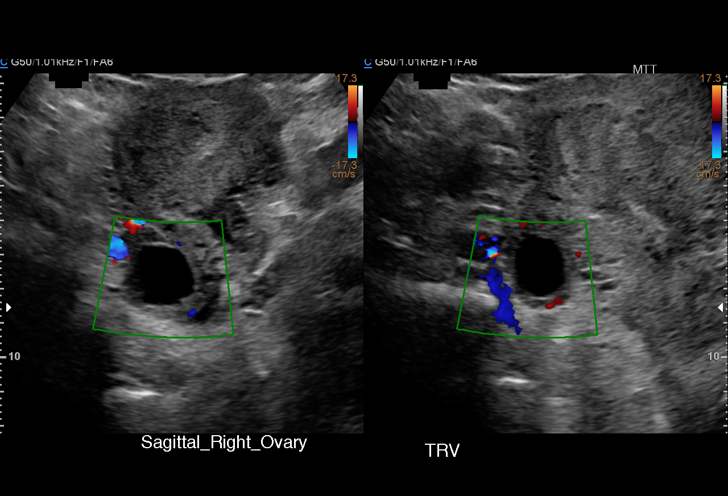
[im 27/92]
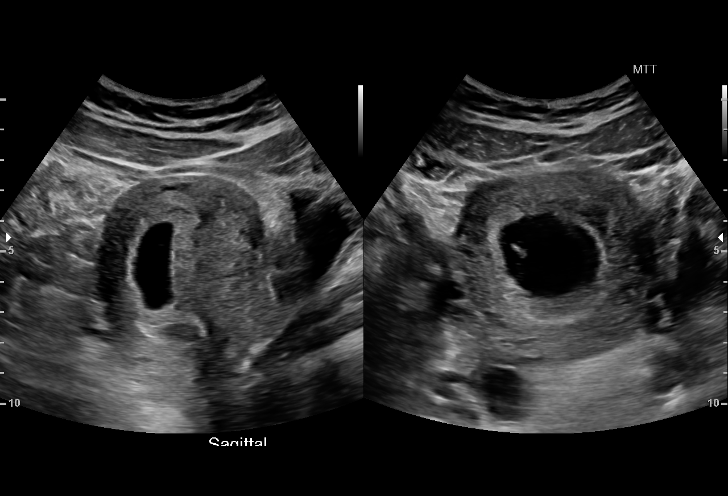
[im 34/92]
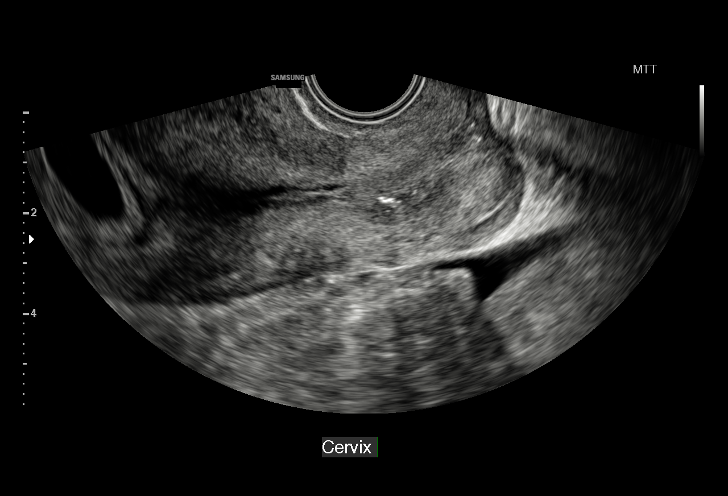
[im 41/92]
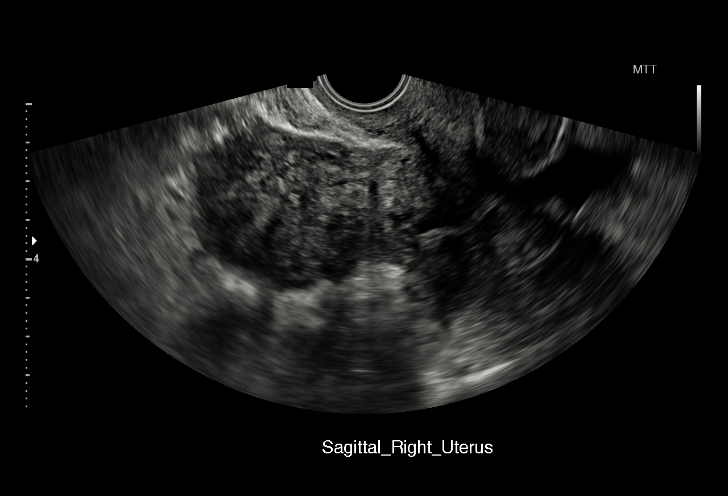
[im 48/92]
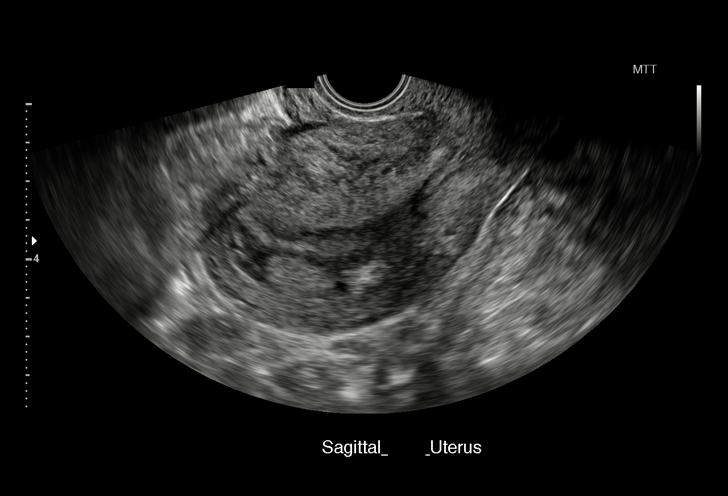
[im 51/92]
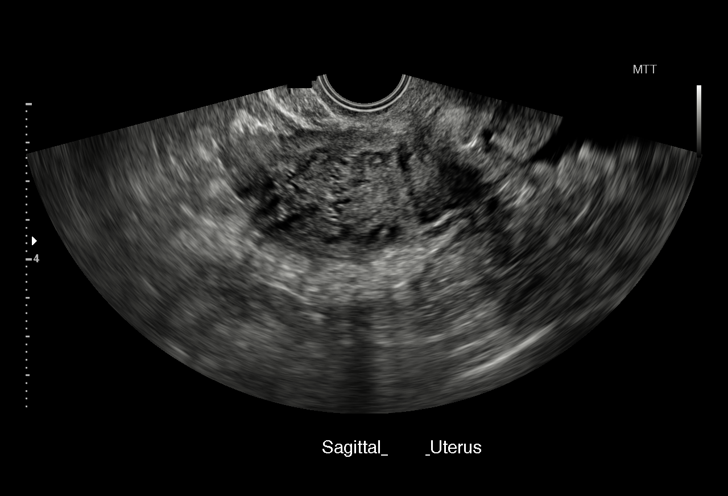
[im 58/92]
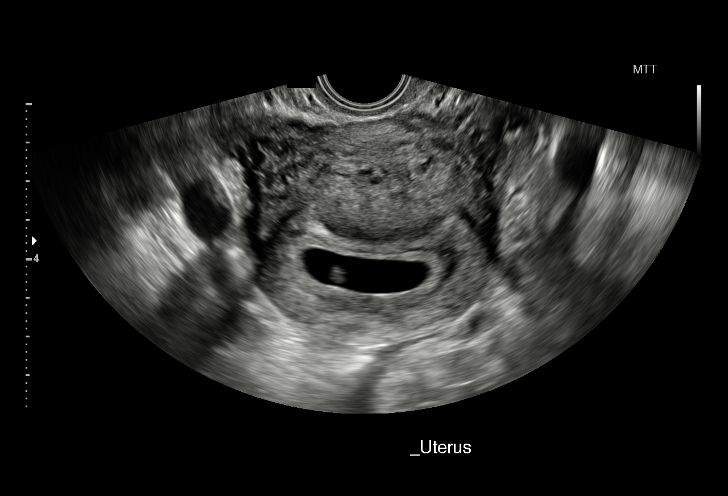
[im 65/92]
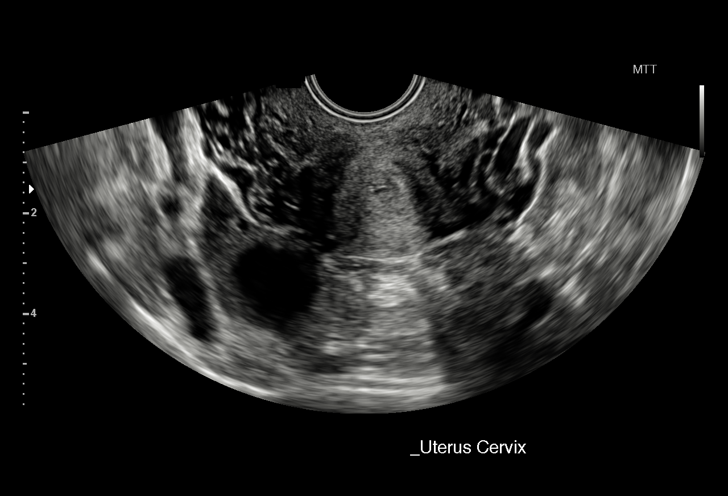
[im 71/92]
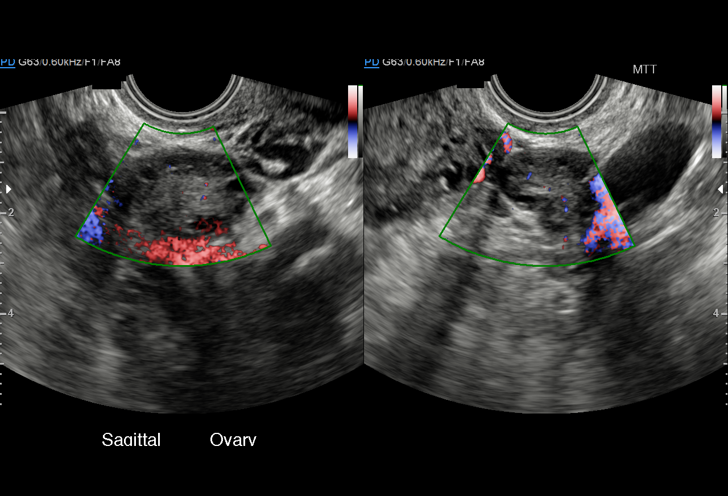
[im 78/92]
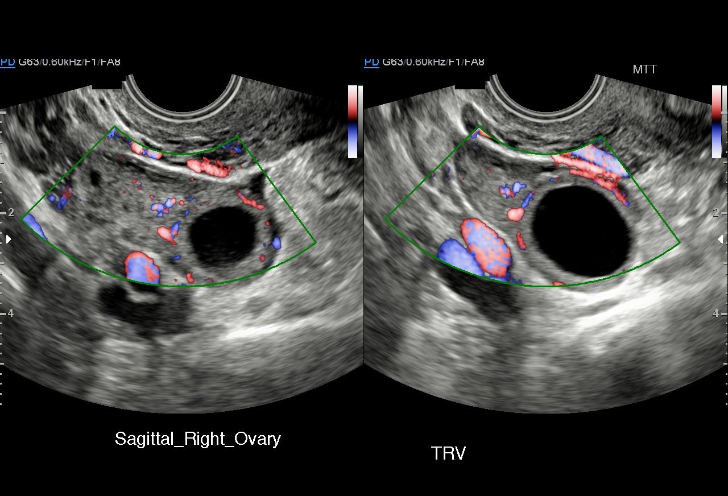
[im 85/92]
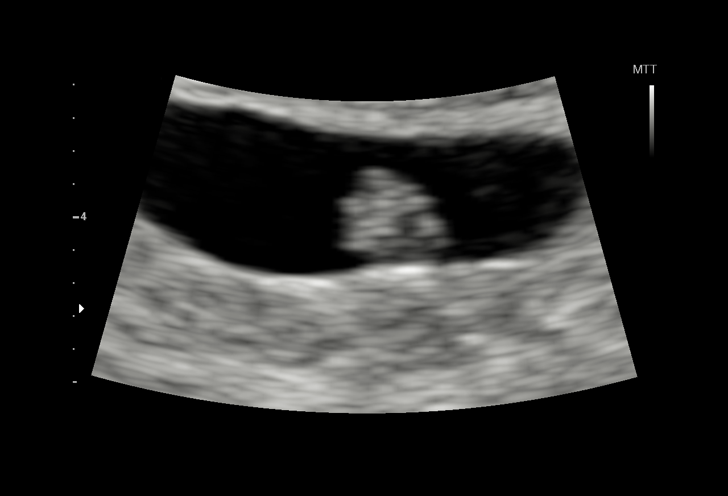
[im 92/92]
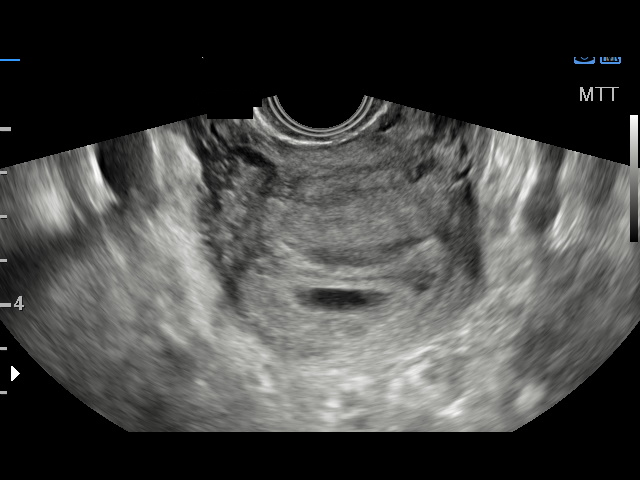

[15 of 28 positions shown; findings below may reference images not displayed]

FINDINGS: Intrauterine gestational sac: Visualized

Yolk sac:  Visualized

Embryo:  Visualized

Cardiac Activity: Visualized

Heart Rate: 115  bpm

CRL: 6.3 mm corresponding to 6 w 3 d US EDC: 02/06/2016

There is a small subchorionic hemorrhage.

Maternal uterus/adnexae: Right ovary measures 3.9 x 2.3 x 2.4 cm and
contains a 2.2 cm simple in appearance cyst. Left ovary measures
x 2.0 x 2.1 cm.
IMPRESSION: Single live intrauterine pregnancy which corresponds to 6 weeks and
3 days gestational.

Small subchorionic hemorrhage.

Normal appearance of the maternal ovaries.

## 2017-01-11 ENCOUNTER — Encounter (HOSPITAL_COMMUNITY): Payer: Self-pay | Admitting: *Deleted
